# Patient Record
Sex: Male | Born: 1965 | Race: Black or African American | Hispanic: No | Marital: Married | Smoking: Never smoker
Health system: Southern US, Community
[De-identification: ages and names within clinical notes are randomized; demographics above are authoritative.]

## PROBLEM LIST (undated history)

## (undated) DIAGNOSIS — K219 Gastro-esophageal reflux disease without esophagitis: Secondary | ICD-10-CM

## (undated) DIAGNOSIS — R0789 Other chest pain: Secondary | ICD-10-CM

## (undated) DIAGNOSIS — I10 Essential (primary) hypertension: Secondary | ICD-10-CM

## (undated) DIAGNOSIS — G4733 Obstructive sleep apnea (adult) (pediatric): Secondary | ICD-10-CM

## (undated) HISTORY — DX: Gastro-esophageal reflux disease without esophagitis: K21.9

## (undated) HISTORY — DX: Obstructive sleep apnea (adult) (pediatric): G47.33

## (undated) HISTORY — DX: Other chest pain: R07.89

---

## 2004-08-18 ENCOUNTER — Ambulatory Visit: Payer: Self-pay | Admitting: Surgery

## 2004-09-19 ENCOUNTER — Emergency Department: Payer: Self-pay | Admitting: Emergency Medicine

## 2004-10-22 ENCOUNTER — Ambulatory Visit: Payer: Self-pay | Admitting: Gastroenterology

## 2005-11-19 ENCOUNTER — Other Ambulatory Visit: Payer: Self-pay

## 2005-11-19 ENCOUNTER — Emergency Department: Payer: Self-pay | Admitting: Emergency Medicine

## 2006-09-15 ENCOUNTER — Ambulatory Visit: Payer: Self-pay | Admitting: Internal Medicine

## 2006-09-18 ENCOUNTER — Ambulatory Visit: Payer: Self-pay | Admitting: Internal Medicine

## 2007-01-06 ENCOUNTER — Emergency Department: Payer: Self-pay | Admitting: Emergency Medicine

## 2007-01-06 ENCOUNTER — Other Ambulatory Visit: Payer: Self-pay

## 2007-02-24 ENCOUNTER — Other Ambulatory Visit: Payer: Self-pay

## 2007-02-24 ENCOUNTER — Emergency Department: Payer: Self-pay | Admitting: Emergency Medicine

## 2008-02-09 ENCOUNTER — Emergency Department: Payer: Self-pay | Admitting: Emergency Medicine

## 2008-02-11 ENCOUNTER — Other Ambulatory Visit: Payer: Self-pay

## 2008-02-11 ENCOUNTER — Emergency Department: Payer: Self-pay | Admitting: Emergency Medicine

## 2008-03-15 ENCOUNTER — Emergency Department: Payer: Self-pay | Admitting: Emergency Medicine

## 2008-05-21 ENCOUNTER — Emergency Department: Payer: Self-pay | Admitting: Emergency Medicine

## 2008-09-18 ENCOUNTER — Emergency Department: Payer: Self-pay | Admitting: Emergency Medicine

## 2010-03-29 ENCOUNTER — Emergency Department: Payer: Self-pay | Admitting: Emergency Medicine

## 2011-06-21 ENCOUNTER — Emergency Department: Payer: Self-pay | Admitting: *Deleted

## 2011-06-21 LAB — CBC WITH DIFFERENTIAL/PLATELET
Basophil %: 0.3 %
Eosinophil #: 0.1 10*3/uL (ref 0.0–0.7)
Eosinophil %: 1.4 %
HGB: 14.9 g/dL (ref 13.0–18.0)
Lymphocyte %: 30.6 %
MCH: 30.3 pg (ref 26.0–34.0)
MCHC: 33 g/dL (ref 32.0–36.0)
MCV: 92 fL (ref 80–100)
Monocyte #: 0.5 10*3/uL (ref 0.0–0.7)
Monocyte %: 8.4 %
Neutrophil %: 59.3 %
RBC: 4.9 10*6/uL (ref 4.40–5.90)
RDW: 13.5 % (ref 11.5–14.5)
WBC: 5.9 10*3/uL (ref 3.8–10.6)

## 2011-06-21 LAB — BASIC METABOLIC PANEL
BUN: 13 mg/dL (ref 7–18)
Chloride: 105 mmol/L (ref 98–107)
Creatinine: 1.19 mg/dL (ref 0.60–1.30)
EGFR (African American): >60
Glucose: 79 mg/dL (ref 65–99)
Potassium: 4.1 mmol/L (ref 3.5–5.1)
Sodium: 143 mmol/L (ref 136–145)

## 2011-06-21 LAB — T4, FREE: Free Thyroxine: 0.87 ng/dL (ref 0.76–1.46)

## 2012-11-25 ENCOUNTER — Emergency Department: Payer: Self-pay | Admitting: Emergency Medicine

## 2016-05-23 DIAGNOSIS — H5711 Ocular pain, right eye: Secondary | ICD-10-CM | POA: Diagnosis not present

## 2016-05-30 ENCOUNTER — Encounter: Payer: Self-pay | Admitting: Internal Medicine

## 2016-05-30 ENCOUNTER — Ambulatory Visit (INDEPENDENT_AMBULATORY_CARE_PROVIDER_SITE_OTHER): Payer: 59 | Admitting: Internal Medicine

## 2016-05-30 VITALS — BP 142/94 | HR 68 | Temp 98.1°F | Ht 67.5 in | Wt 252.0 lb

## 2016-05-30 DIAGNOSIS — R3129 Other microscopic hematuria: Secondary | ICD-10-CM

## 2016-05-30 DIAGNOSIS — Z6838 Body mass index (BMI) 38.0-38.9, adult: Secondary | ICD-10-CM

## 2016-05-30 DIAGNOSIS — E6609 Other obesity due to excess calories: Secondary | ICD-10-CM | POA: Diagnosis not present

## 2016-05-30 DIAGNOSIS — K219 Gastro-esophageal reflux disease without esophagitis: Secondary | ICD-10-CM | POA: Diagnosis not present

## 2016-05-30 DIAGNOSIS — I10 Essential (primary) hypertension: Secondary | ICD-10-CM | POA: Diagnosis not present

## 2016-05-30 DIAGNOSIS — IMO0001 Reserved for inherently not codable concepts without codable children: Secondary | ICD-10-CM | POA: Insufficient documentation

## 2016-05-30 LAB — COMPREHENSIVE METABOLIC PANEL
ALBUMIN: 4 g/dL (ref 3.5–5.2)
ALK PHOS: 61 U/L (ref 39–117)
ALT: 33 U/L (ref 0–53)
AST: 29 U/L (ref 0–37)
BUN: 11 mg/dL (ref 6–23)
CO2: 31 mEq/L (ref 19–32)
Calcium: 9.5 mg/dL (ref 8.4–10.5)
Chloride: 103 mEq/L (ref 96–112)
Creatinine, Ser: 1.29 mg/dL (ref 0.40–1.50)
GFR: 76.43 mL/min (ref >60.00)
Glucose, Bld: 99 mg/dL (ref 70–99)
POTASSIUM: 4.2 meq/L (ref 3.5–5.1)
Sodium: 138 mEq/L (ref 135–145)
TOTAL PROTEIN: 7.6 g/dL (ref 6.0–8.3)
Total Bilirubin: 0.5 mg/dL (ref 0.2–1.2)

## 2016-05-30 LAB — HEMOGLOBIN A1C: HEMOGLOBIN A1C: 6 % (ref 4.6–6.5)

## 2016-05-30 LAB — LIPID PANEL
Cholesterol: 154 mg/dL (ref 0–200)
HDL: 51.8 mg/dL (ref >39.00)
LDL Cholesterol: 90 mg/dL (ref 0–99)
NONHDL: 102.16
TRIGLYCERIDES: 62 mg/dL (ref 0.0–149.0)
Total CHOL/HDL Ratio: 3
VLDL: 12.4 mg/dL (ref 0.0–40.0)

## 2016-05-30 LAB — POC URINALSYSI DIPSTICK (AUTOMATED)
BILIRUBIN UA: NEGATIVE
Blood, UA: NEGATIVE
Glucose, UA: NEGATIVE
Ketones, UA: NEGATIVE
LEUKOCYTES UA: NEGATIVE
NITRITE UA: NEGATIVE
PH UA: 6.5
Protein, UA: NEGATIVE
Spec Grav, UA: 1.025
Urobilinogen, UA: NEGATIVE

## 2016-05-30 LAB — CBC
HEMATOCRIT: 45.2 % (ref 39.0–52.0)
HEMOGLOBIN: 15 g/dL (ref 13.0–17.0)
MCHC: 33.3 g/dL (ref 30.0–36.0)
MCV: 90.6 fl (ref 78.0–100.0)
Platelets: 184 10*3/uL (ref 150.0–400.0)
RBC: 4.98 Mil/uL (ref 4.22–5.81)
RDW: 13.9 % (ref 11.5–15.5)
WBC: 5.7 10*3/uL (ref 4.0–10.5)

## 2016-05-30 LAB — TSH: TSH: 0.98 u[IU]/mL (ref 0.35–4.50)

## 2016-05-30 NOTE — Assessment & Plan Note (Signed)
He is not sure what is triggering this He was on Protonix in the past but does not want to start back on this now Discussed how reflux untreated over long periods of time can lead to Barrett's Esophagus, he still denies treatment at this time

## 2016-05-30 NOTE — Assessment & Plan Note (Signed)
He reports he does not want to start on medication at this time He wants to work on diet and exercise for a trial of 3 months to get this down CBC, CMET today

## 2016-05-30 NOTE — Progress Notes (Signed)
HPI  Pt presents to the clinic today to establish care and for management of the conditions listed below. He has not had a PCP in many years. He reports he recently had a work physical, and he reports they found blood in his urine. He denies testicular pain, urgency, frequency, dysuria or penile discharge.  GERD: He is not sure what triggers this. This occurs 5 out of 7 days a week. He does not take anything OTC for this.  Elevated blood pressure: His BP today is 142/94. He has never been diagnosed with HTN in the past. He has never been on medication in the past.  Flu: 03/2016 [facility] Colon Screening: 2010, every 5 years Vision Screening: annually Dentist: as needed  Past Medical History:  Diagnosis Date  . GERD (gastroesophageal reflux disease)     No current outpatient prescriptions on file.   No current facility-administered medications for this visit.     No Known Allergies  Family History  Problem Relation Age of Onset  . Arthritis Mother     Social History   Social History  . Marital status: Married    Spouse name: N/A  . Number of children: N/A  . Years of education: N/A   Occupational History  . Not on file.   Social History Main Topics  . Smoking status: Never Smoker  . Smokeless tobacco: Never Used  . Alcohol use Yes     Comment: rare  . Drug use: No  . Sexual activity: Not on file   Other Topics Concern  . Not on file   Social History Narrative  . No narrative on file    ROS:  Constitutional: Denies fever, malaise, fatigue, headache or abrupt weight changes.  Respiratory: Denies difficulty breathing, shortness of breath, cough or sputum production.   Cardiovascular: Denies chest pain, chest tightness, palpitations or swelling in the hands or feet.  Gastrointestinal: Pt reports reflux. Denies abdominal pain, bloating, constipation, diarrhea or blood in the stool.  Neurological: Denies dizziness, difficulty with memory, difficulty  with speech or problems with balance and coordination.  Psych: Denies anxiety, depression, SI/HI.  No other specific complaints in a complete review of systems (except as listed in HPI above).  PE: BP (!) 142/94   Pulse 68   Temp 98.1 F (36.7 C) (Oral)   Ht 5' 7.5" (1.715 m)   Wt 252 lb (114.3 kg)   SpO2 98%   BMI 38.89 kg/m   Wt Readings from Last 3 Encounters:  05/30/16 252 lb (114.3 kg)    General: Appears his stated age, obese in NAD. HEENT: Head: normal shape and size; Eyes: sclera white, no icterus, conjunctiva pink, PERRLA and EOMs intact; Ears: Tm's gray and intact, normal light reflex;Throat/Mouth: Teeth present, mucosa pink and moist, no lesions or ulcerations noted.  Neck: Neck supple, trachea midline. No masses, lumps or thyromegaly present.  Cardiovascular: Normal rate and rhythm. S1,S2 noted.  No murmur, rubs or gallops noted. No JVD or BLE edema. No carotid bruits noted. Pulmonary/Chest: Normal effort and positive vesicular breath sounds. No respiratory distress. No wheezes, rales or ronchi noted.  Abdomen: Soft and nontender. Normal bowel sounds, no bruits noted. No distention or masses noted.  Neurological: Alert and oriented.  Psychiatric: Mood and affect normal. Behavior is normal. Judgment and thought content normal.    BMET    Component Value Date/Time   NA 143 06/21/2011 1220   K 4.1 06/21/2011 1220   CL 105 06/21/2011 1220  CO2 30 06/21/2011 1220   GLUCOSE 79 06/21/2011 1220   BUN 13 06/21/2011 1220   CREATININE 1.19 06/21/2011 1220   CALCIUM 9.1 06/21/2011 1220   GFRNONAA >60 06/21/2011 1220   GFRAA >60 06/21/2011 1220    Lipid Panel  No results found for: CHOL, TRIG, HDL, CHOLHDL, VLDL, LDLCALC  CBC    Component Value Date/Time   WBC 5.9 06/21/2011 1220   RBC 4.90 06/21/2011 1220   HGB 14.9 06/21/2011 1220   HCT 45.0 06/21/2011 1220   PLT 184 06/21/2011 1220   MCV 92 06/21/2011 1220   MCH 30.3 06/21/2011 1220   MCHC 33.0 06/21/2011  1220   RDW 13.5 06/21/2011 1220   LYMPHSABS 1.8 06/21/2011 1220   MONOABS 0.5 06/21/2011 1220   EOSABS 0.1 06/21/2011 1220   BASOSABS 0.0 06/21/2011 1220    Hgb A1C No results found for: HGBA1C   Assessment and Plan:

## 2016-05-30 NOTE — Assessment & Plan Note (Signed)
Discussed the importance of diet and exercise Will check TSH, A1C and Lipid profile  today

## 2016-05-30 NOTE — Patient Instructions (Signed)
Obesity, Adult Introduction Obesity is having too much body fat. If you have a BMI of 30 or more, you are obese. BMI is a number that explains how much body fat you have. Obesity is often caused by taking in (consuming) more calories than your body uses. Obesity can cause serious health problems. Changing your lifestyle can help to treat obesity. Follow these instructions at home: Eating and drinking  Follow advice from your doctor about what to eat and drink. Your doctor may tell you to:  Cut down on (limit) fast foods, sweets, and processed snack foods.  Choose low-fat options. For example, choose low-fat milk instead of whole milk.  Eat 5 or more servings of fruits or vegetables every day.  Eat at home more often. This gives you more control over what you eat.  Choose healthy foods when you eat out.  Learn what a healthy portion size is. A portion size is the amount of a certain food that is healthy for you to eat at one time. This is different for each person.  Keep low-fat snacks available.  Avoid sugary drinks. These include soda, fruit juice, iced tea that is sweetened with sugar, and flavored milk.  Eat a healthy breakfast.  Drink enough water to keep your pee (urine) clear or pale yellow.  Do not go without eating for long periods of time (do not fast).  Do not go on popular or trendy diets (fad diets). Physical Activity  Exercise often, as told by your doctor. Ask your doctor:  What types of exercise are safe for you.  How often you should exercise.  Warm up and stretch before being active.  Do slow stretching after being active (cool down).  Rest between times of being active. Lifestyle  Limit how much time you spend in front of your TV, computer, or video game system (be less sedentary).  Find ways to reward yourself that do not involve food.  Limit alcohol intake to no more than 1 drink a day for nonpregnant women and 2 drinks a day for men. One drink  equals 12 oz of beer, 5 oz of wine, or 1 oz of hard liquor. General instructions  Keep a weight loss journal. This can help you keep track of:  The food that you eat.  The exercise that you do.  Take over-the-counter and prescription medicines only as told by your doctor.  Take vitamins and supplements only as told by your doctor.  Think about joining a support group. Your doctor may be able to help with this.  Keep all follow-up visits as told by your doctor. This is important. Contact a doctor if:  You cannot meet your weight loss goal after you have changed your diet and lifestyle for 6 weeks. This information is not intended to replace advice given to you by your health care provider. Make sure you discuss any questions you have with your health care provider. Document Released: 07/18/2011 Document Revised: 10/01/2015 Document Reviewed: 02/11/2015  2017 Elsevier  

## 2016-05-31 ENCOUNTER — Ambulatory Visit: Payer: 59 | Admitting: Family Medicine

## 2016-06-02 NOTE — Addendum Note (Signed)
Addended by: Roena MaladyEVONTENNO, MELANIE Y on: 06/02/2016 01:10 PM   Modules accepted: Orders

## 2016-08-04 ENCOUNTER — Ambulatory Visit (INDEPENDENT_AMBULATORY_CARE_PROVIDER_SITE_OTHER): Payer: 59 | Admitting: Family Medicine

## 2016-08-04 ENCOUNTER — Encounter: Payer: Self-pay | Admitting: Family Medicine

## 2016-08-04 ENCOUNTER — Other Ambulatory Visit: Payer: Self-pay | Admitting: Family Medicine

## 2016-08-04 VITALS — BP 140/84 | HR 56 | Temp 98.7°F | Ht 67.5 in | Wt 241.2 lb

## 2016-08-04 DIAGNOSIS — N4889 Other specified disorders of penis: Secondary | ICD-10-CM | POA: Diagnosis not present

## 2016-08-04 DIAGNOSIS — N342 Other urethritis: Secondary | ICD-10-CM

## 2016-08-04 NOTE — Progress Notes (Signed)
Pre visit review using our clinic review tool, if applicable. No additional management support is needed unless otherwise documented below in the visit note. 

## 2016-08-04 NOTE — Progress Notes (Signed)
Dr. Karleen Hampshire T. Tara Wich, MD, CAQ Sports [facility] [address] [place], [address] Phone: [phone] Fax: [phone]  08/04/2016  Patient: Chad Schultz, MRN: [id number], DOB: [birth year], 51 y.o.  Primary Physician:  Nicki Reaper, NP   Chief Complaint  Patient presents with  . Penis Pain   Subjective:   Chad Schultz is a 51 y.o. very pleasant male patient who presents with the following:  Wife had a UTI. Hurting inside during sex and also with urination.   No discharge or sores.  No pain with ejaculation.  No known STD exposure.  He is not having any pain at all and his testicular region, and he is not having pain in his perineal region.  No pain with having a bowel movement.  He is not having any bloody discharge from his penis.  Past Medical History, Surgical History, Social History, Family History, Problem List, Medications, and Allergies have been reviewed and updated if relevant.  Patient Active Problem List   Diagnosis Date Noted  . Gastroesophageal reflux disease 05/30/2016  . Essential hypertension 05/30/2016  . Class 2 obesity due to excess calories with serious comorbidity and body mass index (BMI) of 38.0 to 38.9 in adult 05/30/2016    Past Medical History:  Diagnosis Date  . GERD (gastroesophageal reflux disease)     No past surgical history on file.  Social History   Social History  . Marital status: Married    Spouse name: N/A  . Number of children: N/A  . Years of education: N/A   Occupational History  . Not on file.   Social History Main Topics  . Smoking status: Never Smoker  . Smokeless tobacco: Never Used  . Alcohol use Yes     Comment: rare  . Drug use: No  . Sexual activity: Yes   Other Topics Concern  . Not on file   Social History Narrative  . No narrative on file    Family History  Problem Relation Age of Onset  . Arthritis Mother     No Known Allergies  Medication list  reviewed and updated in full in [facility].   GEN: No acute illnesses, no fevers, chills. GI: No n/v/d, eating normally Pulm: No SOB Interactive and getting along well at home.  Otherwise, ROS is as per the HPI.  Objective:   BP 140/84   Pulse (!) 56   Temp 98.7 F (37.1 C) (Oral)   Ht 5' 7.5" (1.715 m)   Wt 241 lb 4 oz (109.4 kg)   BMI 37.23 kg/m   GEN: WDWN, NAD, Non-toxic, A & O x 3 HEENT: Atraumatic, Normocephalic. Neck supple. No masses, No LAD. Ears and Nose: No external deformity. EXTR: No c/c/e NEURO Normal gait.  PSYCH: Normally interactive. Conversant. Not depressed or anxious appearing.  Calm demeanor.   GU: normal-appearing male, circumcised, normal phallus without discharge present.  No ulceration.  Testicles are normal and nontender and palpation.  No hernias are present.  Laboratory and Imaging Data: Results for orders placed or performed in visit on 08/04/16  HIV antibody  Result Value Ref Range   HIV 1&2 Ab, 4th Generation NONREACTIVE NONREACTIVE  Hepatitis C antibody  Result Value Ref Range   HCV Ab NEGATIVE NEGATIVE     Assessment and Plan:   Penile pain - Plan: HIV antibody, Hepatitis C antibody, Urinalysis, Routine w reflex microscopic, Urine culture, GC/Chlamydia Probe Amp, CANCELED: GC/Chlamydia Probe Amp, CANCELED:  GC/chlamydia probe amp, urine  Urethritis  At this time HIV, Hep C, GC, CMZ all neg. RPR added to lab orders over phone.  Urine culture is pending.  Most likely urethritis without STD involvement.  Follow-up: No Follow-up on file.  Orders Placed This Encounter  Procedures  . Urine culture  . GC/Chlamydia Probe Amp  . HIV antibody  . Hepatitis C antibody  . Urinalysis, Routine w reflex microscopic    Signed,  Annsley Akkerman T. Shauntia Levengood, MD   Allergies as of 08/04/2016   No Known Allergies     Medication List    as of 08/04/2016 11:59 PM   You have not been prescribed any medications.

## 2016-08-05 LAB — URINE CULTURE: ORGANISM ID, BACTERIA: NO GROWTH

## 2016-08-05 LAB — GC/CHLAMYDIA PROBE AMP
CT Probe RNA: NOT DETECTED
GC PROBE AMP APTIMA: NOT DETECTED

## 2016-08-05 LAB — URINALYSIS, ROUTINE W REFLEX MICROSCOPIC

## 2016-08-05 LAB — HEPATITIS C ANTIBODY: HCV Ab: NEGATIVE

## 2016-08-05 LAB — HIV ANTIBODY (ROUTINE TESTING W REFLEX): HIV 1&2 Ab, 4th Generation: NONREACTIVE

## 2016-08-06 LAB — RPR

## 2016-08-07 ENCOUNTER — Other Ambulatory Visit: Payer: Self-pay | Admitting: Family Medicine

## 2016-08-07 MED ORDER — DOXYCYCLINE HYCLATE 100 MG PO TABS: 100.0000 mg | ORAL_TABLET | Freq: Two times a day (BID) | ORAL | 0 refills | Status: AC

## 2017-04-05 ENCOUNTER — Encounter: Payer: Self-pay | Admitting: Family Medicine

## 2017-04-05 ENCOUNTER — Ambulatory Visit: Payer: BC Managed Care – PPO | Admitting: Family Medicine

## 2017-04-05 ENCOUNTER — Telehealth: Payer: Self-pay

## 2017-04-05 ENCOUNTER — Ambulatory Visit: Payer: Self-pay | Admitting: Family Medicine

## 2017-04-05 VITALS — BP 140/80 | HR 70 | Temp 97.6°F | Wt 242.2 lb

## 2017-04-05 DIAGNOSIS — M79642 Pain in left hand: Secondary | ICD-10-CM

## 2017-04-05 DIAGNOSIS — M79641 Pain in right hand: Secondary | ICD-10-CM | POA: Diagnosis not present

## 2017-04-05 LAB — CBC WITH DIFFERENTIAL/PLATELET
BASOS ABS: 0 10*3/uL (ref 0.0–0.1)
Basophils Relative: 0.4 % (ref 0.0–3.0)
EOS ABS: 0.1 10*3/uL (ref 0.0–0.7)
Eosinophils Relative: 1.7 % (ref 0.0–5.0)
HCT: 43.8 % (ref 39.0–52.0)
Hemoglobin: 14.3 g/dL (ref 13.0–17.0)
LYMPHS ABS: 1.9 10*3/uL (ref 0.7–4.0)
Lymphocytes Relative: 27.9 % (ref 12.0–46.0)
MCHC: 32.8 g/dL (ref 30.0–36.0)
MCV: 93.6 fl (ref 78.0–100.0)
MONO ABS: 0.7 10*3/uL (ref 0.1–1.0)
MONOS PCT: 10.7 % (ref 3.0–12.0)
NEUTROS ABS: 4.1 10*3/uL (ref 1.4–7.7)
NEUTROS PCT: 59.3 % (ref 43.0–77.0)
PLATELETS: 175 10*3/uL (ref 150.0–400.0)
RBC: 4.68 Mil/uL (ref 4.22–5.81)
RDW: 14.4 % (ref 11.5–15.5)
WBC: 7 10*3/uL (ref 4.0–10.5)

## 2017-04-05 LAB — HEPATIC FUNCTION PANEL
ALBUMIN: 4 g/dL (ref 3.5–5.2)
ALK PHOS: 66 U/L (ref 39–117)
ALT: 23 U/L (ref 0–53)
AST: 26 U/L (ref 0–37)
Bilirubin, Direct: 0.1 mg/dL (ref 0.0–0.3)
TOTAL PROTEIN: 7.1 g/dL (ref 6.0–8.3)
Total Bilirubin: 0.6 mg/dL (ref 0.2–1.2)

## 2017-04-05 LAB — VITAMIN B12: VITAMIN B 12: 632 pg/mL (ref 211–911)

## 2017-04-05 LAB — BASIC METABOLIC PANEL
BUN: 22 mg/dL (ref 6–23)
CALCIUM: 9.3 mg/dL (ref 8.4–10.5)
CO2: 29 meq/L (ref 19–32)
Chloride: 106 mEq/L (ref 96–112)
Creatinine, Ser: 1.4 mg/dL (ref 0.40–1.50)
GFR: 69.29 mL/min (ref >60.00)
GLUCOSE: 94 mg/dL (ref 70–99)
POTASSIUM: 4 meq/L (ref 3.5–5.1)
Sodium: 140 mEq/L (ref 135–145)

## 2017-04-05 LAB — URIC ACID: URIC ACID, SERUM: 5.8 mg/dL (ref 4.0–7.8)

## 2017-04-05 LAB — SEDIMENTATION RATE: SED RATE: 15 mm/h (ref 0–15)

## 2017-04-05 LAB — TSH: TSH: 1.12 u[IU]/mL (ref 0.35–4.50)

## 2017-04-05 LAB — C-REACTIVE PROTEIN: CRP: 0.3 mg/dL — AB (ref 0.5–20.0)

## 2017-04-05 NOTE — Progress Notes (Signed)
   Subjective:    Patient ID: Chad Schultz, male    DOB: 1965/12/01, 51 y.o.   MRN: [id number]  HPI Here for 3 months of left hand symptoms and now one weel of similar symptoms in the right hand. These are intermittent. At times he feels pain in the dorsum of each hand, at times he feels numbness and tingling of the dorsum of each hand. No erythem aor warmth, no swelling. He has not taken anything for this. No other joint issues. Of note his mother has rheumatoid arthritis and his father has gout.    Review of Systems  Constitutional: Negative.   Respiratory: Negative.   Cardiovascular: Negative.   Musculoskeletal: Positive for arthralgias. Negative for joint swelling.  Neurological: Positive for numbness. Negative for weakness.       Objective:   Physical Exam  Constitutional: He is oriented to person, place, and time. He appears well-developed and well-nourished.  Cardiovascular: Normal rate, regular rhythm, normal heart sounds and intact distal pulses.  Pulmonary/Chest: Effort normal and breath sounds normal. No respiratory distress. He has no wheezes. He has no rales.  Musculoskeletal:  Both hands are normal on exam today. No warmth or erythema, no swelling, and no tenderness.   Neurological: He is alert and oriented to person, place, and time.          Assessment & Plan:  Hand pain. It is not clear what this may be stemming from. I suggested he take an Aleve BID. Get labs today to check for various arthritis markers. Follow up with Nicki Reaperegina Baity, his PCP. Gershon CraneStephen Dusan Lipford, MD

## 2017-04-05 NOTE — Telephone Encounter (Signed)
Pt walked in; [facility] or [facility]. Pt said for 3 wks on and off tops of both hands hurt and at times when moves hands feels like something is ripping. When hurts pain level is 8. Pt said feels worse today. Pt scheduled appt to see Dr Clent RidgesFry today at 2 PM.

## 2017-04-06 LAB — RHEUMATOID FACTOR

## 2017-04-18 ENCOUNTER — Ambulatory Visit: Payer: BC Managed Care – PPO | Admitting: Internal Medicine

## 2017-04-24 ENCOUNTER — Ambulatory Visit: Payer: BC Managed Care – PPO | Admitting: Internal Medicine

## 2017-04-26 ENCOUNTER — Encounter: Payer: Self-pay | Admitting: Family Medicine

## 2017-04-26 ENCOUNTER — Ambulatory Visit: Payer: BC Managed Care – PPO | Admitting: Family Medicine

## 2017-04-26 VITALS — BP 122/80 | HR 62 | Temp 98.4°F | Wt 241.4 lb

## 2017-04-26 DIAGNOSIS — G5603 Carpal tunnel syndrome, bilateral upper limbs: Secondary | ICD-10-CM

## 2017-04-26 DIAGNOSIS — M7711 Lateral epicondylitis, right elbow: Secondary | ICD-10-CM

## 2017-04-26 MED ORDER — DICLOFENAC SODIUM 75 MG PO TBEC: 75.0000 mg | DELAYED_RELEASE_TABLET | Freq: Two times a day (BID) | ORAL | 0 refills | Status: DC

## 2017-04-26 NOTE — Progress Notes (Signed)
   Subjective:    Patient ID: Chad Schultz, male    DOB: [birth year], 51 y.o.   MRN: [id number]  HPI Here for several issues. First he saw us a few weeks ago for pain and numbness in both hands. This has persisted though Ibuprofen helps a little. Also he has had 2 weeks of pain in the right forearm. This starts at the elbow and runs down to the thumb. At our last visit we got labs including inflammatory markers, and these were all negative.    Review of Systems  Constitutional: Negative.   Respiratory: Negative.   Cardiovascular: Negative.   Musculoskeletal: Positive for arthralgias.  Neurological: Positive for numbness. Negative for weakness.       Objective:   Physical Exam  Constitutional: He is oriented to person, place, and time. He appears well-developed and well-nourished.  Cardiovascular: Normal rate, regular rhythm, normal heart sounds and intact distal pulses.  Pulmonary/Chest: Effort normal and breath sounds normal. No respiratory distress. He has no rales.  Musculoskeletal:  The right lateral epicondyle is tender, no swelling and full ROM   Neurological: He is alert and oriented to person, place, and time.          Assessment & Plan:  He has lateral epicondylitis, and he will use rest and ice and Diclofenac. For the hand pain and numbness, carpal tunnel syndrome is a possible etiology. We will set up bilateral nerve conduction studies.  Gershon CraneStephen Shota Kohrs, MD

## 2018-08-09 ENCOUNTER — Ambulatory Visit: Payer: BC Managed Care – PPO | Admitting: Family Medicine

## 2018-08-10 ENCOUNTER — Ambulatory Visit: Payer: BC Managed Care – PPO | Admitting: Family Medicine

## 2018-08-17 ENCOUNTER — Ambulatory Visit (INDEPENDENT_AMBULATORY_CARE_PROVIDER_SITE_OTHER): Payer: BC Managed Care – PPO | Admitting: Family Medicine

## 2018-08-17 ENCOUNTER — Other Ambulatory Visit: Payer: Self-pay

## 2018-08-17 DIAGNOSIS — Z5329 Procedure and treatment not carried out because of patient's decision for other reasons: Secondary | ICD-10-CM

## 2018-08-17 NOTE — Progress Notes (Signed)
No show

## 2018-09-13 ENCOUNTER — Emergency Department: Payer: BC Managed Care – PPO

## 2018-09-13 ENCOUNTER — Encounter: Payer: Self-pay | Admitting: Emergency Medicine

## 2018-09-13 ENCOUNTER — Other Ambulatory Visit: Payer: Self-pay

## 2018-09-13 ENCOUNTER — Emergency Department
Admission: EM | Admit: 2018-09-13 | Discharge: 2018-09-13 | Disposition: A | Discharge status: Home / Self Care | Payer: BC Managed Care – PPO | Attending: Student in an Organized Health Care Education/Training Program | Admitting: Student in an Organized Health Care Education/Training Program

## 2018-09-13 DIAGNOSIS — R519 Headache, unspecified: Secondary | ICD-10-CM

## 2018-09-13 DIAGNOSIS — Z1159 Encounter for screening for other viral diseases: Secondary | ICD-10-CM | POA: Insufficient documentation

## 2018-09-13 DIAGNOSIS — K219 Gastro-esophageal reflux disease without esophagitis: Secondary | ICD-10-CM | POA: Diagnosis not present

## 2018-09-13 DIAGNOSIS — I10 Essential (primary) hypertension: Secondary | ICD-10-CM | POA: Insufficient documentation

## 2018-09-13 DIAGNOSIS — R51 Headache: Secondary | ICD-10-CM | POA: Diagnosis not present

## 2018-09-13 LAB — COMPREHENSIVE METABOLIC PANEL
ALT: 33 U/L (ref 0–44)
AST: 33 U/L (ref 15–41)
Albumin: 4 g/dL (ref 3.5–5.0)
Alkaline Phosphatase: 54 U/L (ref 38–126)
Anion gap: 6 (ref 5–15)
BUN: 21 mg/dL — ABNORMAL HIGH (ref 6–20)
CO2: 29 mmol/L (ref 22–32)
Calcium: 9.3 mg/dL (ref 8.9–10.3)
Chloride: 103 mmol/L (ref 98–111)
Creatinine, Ser: 1.25 mg/dL — ABNORMAL HIGH (ref 0.61–1.24)
GFR calc Af Amer: >60 mL/min (ref >60)
GFR calc non Af Amer: >60 mL/min (ref >60)
Glucose, Bld: 99 mg/dL (ref 70–99)
Potassium: 4.5 mmol/L (ref 3.5–5.1)
Sodium: 138 mmol/L (ref 135–145)
Total Bilirubin: 0.6 mg/dL (ref 0.3–1.2)
Total Protein: 7.9 g/dL (ref 6.5–8.1)

## 2018-09-13 LAB — CBC WITH DIFFERENTIAL/PLATELET
Abs Immature Granulocytes: 0.02 10*3/uL (ref 0.00–0.07)
Basophils Absolute: 0 10*3/uL (ref 0.0–0.1)
Basophils Relative: 1 %
Eosinophils Absolute: 0.2 10*3/uL (ref 0.0–0.5)
Eosinophils Relative: 4 %
HCT: 44.5 % (ref 39.0–52.0)
Hemoglobin: 14.8 g/dL (ref 13.0–17.0)
Immature Granulocytes: 0 %
Lymphocytes Relative: 36 %
Lymphs Abs: 2.2 10*3/uL (ref 0.7–4.0)
MCH: 30.5 pg (ref 26.0–34.0)
MCHC: 33.3 g/dL (ref 30.0–36.0)
MCV: 91.8 fL (ref 80.0–100.0)
Monocytes Absolute: 0.7 10*3/uL (ref 0.1–1.0)
Monocytes Relative: 12 %
Neutro Abs: 2.9 10*3/uL (ref 1.7–7.7)
Neutrophils Relative %: 47 %
Platelets: 189 10*3/uL (ref 150–400)
RBC: 4.85 MIL/uL (ref 4.22–5.81)
RDW: 13.3 % (ref 11.5–15.5)
Smear Review: NORMAL
WBC: 6 10*3/uL (ref 4.0–10.5)
nRBC: 0 % (ref 0.0–0.2)

## 2018-09-13 LAB — SARS CORONAVIRUS 2 BY RT PCR (HOSPITAL ORDER, PERFORMED IN ~~LOC~~ HOSPITAL LAB): SARS Coronavirus 2: NEGATIVE

## 2018-09-13 LAB — TROPONIN I: Troponin I: <0.03 ng/mL (ref <0.03)

## 2018-09-13 LAB — LIPASE, BLOOD: Lipase: 37 U/L (ref 11–51)

## 2018-09-13 MED ORDER — AMLODIPINE BESYLATE 5 MG PO TABS: 5.0000 mg | ORAL_TABLET | Freq: Every day | ORAL | 5 refills | Status: DC

## 2018-09-13 MED ORDER — AMLODIPINE BESYLATE 5 MG PO TABS
5.0000 mg | ORAL_TABLET | Freq: Once | ORAL | Status: AC
  Administered 2018-09-13: 5 mg via ORAL
  Filled 2018-09-13: qty 1

## 2018-09-13 NOTE — ED Triage Notes (Signed)
Patient ambulatory to triage with steady gait, without difficulty or distress noted, mask in place; pt reports occipital HA for several days, mostly at night with no accomp symptoms; denies hx of same

## 2018-09-13 NOTE — Discharge Instructions (Signed)
As we discussed, I believe that your headaches are caused by a variety of reasons.  I think you probably suffer from sleep apnea, although that is not something I can diagnose from the emergency department.  I included some information in this packet for you to read about.  You need to follow-up with a primary care provider to discuss whether or not you should have a sleep study performed.  Additionally I think you are having some symptoms of acid reflux.  I also included some information about this and encourage you to try an over-the-counter acid reflux medication such as Prilosec OTC.  Finally, it does appear that you have at least a degree of high blood pressure (hypertension).  Since you are not actively going to a primary care doctor at this time, I have started you on a low-dose of a blood pressure medicine.  I encourage you to follow-up in the next available opportunity with a primary care provider and discuss your symptoms and see how your blood pressure is doing on the medication I have prescribed.  If you develop any new or worsening symptoms that concern you, please return immediately to the nearest emergency department.

## 2018-09-13 NOTE — ED Provider Notes (Signed)
[facility] Emergency Department Provider Note  ____________________________________________   First MD Initiated Contact with Patient 09/13/18 [phone]     (approximate)  I have reviewed the triage vital signs and the nursing notes.   HISTORY  Chief Complaint Headache    HPI Chad Schultz is a 53 y.o. male with medical history as listed below who presents for evaluation of a variety of complaints.  He states that he has had an intermittent headache that is mostly in the back of his head but sometimes involves the whole head.  This is been going on for about 3 weeks.  It is worse at night and after he just wakes up and gets better throughout the course of the day.  He also states that over the last week or more he has had a sore throat that feels worse in the morning and he has felt like his throat is very dry.  He has had some general malaise, generalized weakness and in general just not feeling well.  3 weeks ago he had some muscle aches that felt kind of like the flu but that resolved.  He has had some nausea particular in the mornings but no vomiting.  He denies fever/chills.  He has no difficulty swallowing.  He has had a mild cough from time to time.  No shortness of breath, no chest pain.  He occasionally has some burning upper abdominal pain that he associates with acid reflux and he states that that particular pain is worse if he eats before he goes to bed.   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        Past Medical History:  Diagnosis Date   GERD (gastroesophageal reflux disease)     Patient Active Problem List   Diagnosis Date Noted   Gastroesophageal reflux disease 05/30/2016   Essential hypertension 05/30/2016   Class 2 obesity due to excess calories with serious comorbidity and body mass index (BMI) of 38.0 to  38.9 in adult 05/30/2016    History reviewed. No pertinent surgical history.  Prior to Admission medications   Medication Sig Start Date End Date Taking? Authorizing Provider  amLODipine (NORVASC) 5 MG tablet Take 1 tablet (5 mg total) by mouth daily. 09/13/18 09/13/19  Loleta Rose, MD  diclofenac (VOLTAREN) 75 MG EC tablet Take 1 tablet (75 mg total) by mouth 2 (two) times daily. 04/26/17   Nelwyn Salisbury, MD  ibuprofen (ADVIL,MOTRIN) 200 MG tablet Take 200 mg by mouth every 12 (twelve) hours as needed.    [provider]    Allergies Patient has no known allergies.  Family History  Problem Relation Age of Onset   Arthritis Mother     Social History Social History   Tobacco Use   Smoking status: Never Smoker   Smokeless tobacco: Never Used  Substance Use Topics   Alcohol use: Yes    Comment: rare   Drug use: No    Review of Systems Constitutional: No fever/chills Eyes: No visual changes. ENT: Intermittent sore throat as described above.  Intermittent dry mouth as described above. Cardiovascular: Denies chest pain. Respiratory: Denies shortness of breath.  Occasional cough. Gastrointestinal: Intermittent upper abdominal burning with nausea, no vomiting.  No diarrhea.  No constipation. Genitourinary: Negative for dysuria. Musculoskeletal: Negative for neck pain.  Negative for back pain. Integumentary: Negative for rash.  Neurological: Intermittent headache as described above.  No focal numbness nor weakness.   ____________________________________________   PHYSICAL EXAM:  VITAL SIGNS: ED Triage Vitals  Enc Vitals Group     BP 09/13/18 0605 (!) 177/107     Pulse Rate 09/13/18 0605 (!) 54     Resp 09/13/18 0605 18     Temp 09/13/18 0605 97.7 F (36.5 C)     Temp Source 09/13/18 0605 Oral     SpO2 09/13/18 0605 100 %     Weight 09/13/18 0600 104.3 kg (230 lb)     Height 09/13/18 0600 1.727 m (5\' 8" )     Head Circumference --      Peak Flow --       Pain Score 09/13/18 0559 5     Pain Loc --      Pain Edu? --      Excl. in GC? --     Constitutional: Alert and oriented. Well appearing and in no acute distress. Eyes: Conjunctivae are normal. PERRL. EOMI. Head: Atraumatic. Nose: No congestion/rhinnorhea. Mouth/Throat: Mucous membranes are moist. Neck: No stridor.  No meningeal signs.   Cardiovascular: Mild bradycardia, regular rhythm. Good peripheral circulation. Grossly normal heart sounds. Respiratory: Normal respiratory effort.  No retractions. No audible wheezing. Gastrointestinal: Moderate obesity.  Soft and nontender. No distention.  Musculoskeletal: No lower extremity tenderness nor edema. No gross deformities of extremities. Neurologic:  Normal speech and language. No gross focal neurologic deficits are appreciated.  Skin:  Skin is warm, dry and intact. No rash noted. Psychiatric: Mood and affect are normal. Speech and behavior are normal.  ____________________________________________   LABS (all labs ordered are listed, but only abnormal results are displayed)  Labs Reviewed  SARS CORONAVIRUS 2 (HOSPITAL ORDER, PERFORMED IN [facility])  CBC WITH DIFFERENTIAL/PLATELET  TROPONIN I  COMPREHENSIVE METABOLIC PANEL  LIPASE, BLOOD   ____________________________________________  EKG  ED ECG REPORT I, Loleta Roseory Belvin Gauss, the attending physician, personally viewed and interpreted this ECG.  Date: 09/13/2018 EKG Time: 7:14 Rate: 55 Rhythm: normal bradycardia QRS Axis: normal Intervals: mildly prolonged QTc at 493 ms ST/T Wave abnormalities: Non-specific ST segment / T-wave changes, but no clear evidence of acute ischemia. Narrative Interpretation: no definitive evidence of acute ischemia; does not meet STEMI criteria.   ____________________________________________  RADIOLOGY   ED MD interpretation: CT head without contrast is pending.  Official radiology report(s): No results  found.  ____________________________________________   PROCEDURES   Procedure(s) performed (including Critical Care):  Procedures   ____________________________________________   INITIAL IMPRESSION / MDM / ASSESSMENT AND PLAN / ED COURSE  As part of my medical decision making, I reviewed the following data within the electronic MEDICAL RECORD NUMBER Nursing notes reviewed and incorporated, Labs reviewed , Old chart reviewed, Patient signed out to Dr. Roxan Hockeyobinson and reviewed Notes from prior ED visits      *Ernestene MentionRobert L Holwerda was evaluated in Emergency Department on 09/13/2018 for the symptoms described in the history of present illness. He was evaluated in the context of the global COVID-19 pandemic, which necessitated consideration that the patient might be at risk for infection with the SARS-CoV-2 virus that causes COVID-19. Institutional protocols and algorithms that pertain to the evaluation of patients at risk for COVID-19 are in a state of rapid change based on information released by regulatory bodies including the CDC and federal and state organizations. These policies and algorithms were followed during the patient's care in the ED.*  Differential diagnosis includes, but  is not limited to, sleep apnea, acid reflux, migraine headache, cluster headache, acute intracranial bleeding, brain neoplasm, pancreatitis, biliary disease, ACS, COVID-19.  The patient is having no respiratory symptoms but he has a variety of other symptoms suggestive of viral illness including a sore throat, headache, recent myalgias, etc.  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  This is obviously of concern to him.  However overall his constellation of symptoms I think is most likely due to poorly controlled hypertension, sleep apnea, and acid reflux.  The patient and I discussed all of this  including my presumptive diagnoses.  He has a primary care doctor but he never goes to see her.  I am going to evaluate him probably given his lack of ability to follow-up easily, particularly in the middle of a COVID-19 pandemic, with lab work, CT scan of his head to rule out acute intracranial issues, EKG, and coronavirus testing given his higher risk than normal due to where he works.  His blood pressure is consistently elevated in the ED and under the circumstances I think it is reasonable to start him on a low-dose of amlodipine 5 mg by mouth daily at least until he can follow-up with her primary care provider.  I am giving him a first dose now.  I am transferring emergency department care to Dr. Roxan Hockey to follow-up on the imaging and lab results.  Anticipate discharge with appropriate precautions if he test positive for COVID-19 and otherwise the discharge paperwork has been prepared for outpatient follow-up with a PCP.      ____________________________________________  FINAL CLINICAL IMPRESSION(S) / ED DIAGNOSES  Final diagnoses:  Nonintractable episodic headache, unspecified headache type  Essential hypertension  Gastroesophageal reflux disease, esophagitis presence not specified     MEDICATIONS GIVEN DURING THIS VISIT:  Medications  amLODipine (NORVASC) tablet 5 mg (has no administration in time range)     ED Discharge Orders         Ordered    amLODipine (NORVASC) 5 MG tablet  Daily     09/13/18 0641           Note:  This document was prepared using Dragon voice recognition software and may include unintentional dictation errors.   Loleta Rose, MD 09/13/18 [phone]

## 2018-09-13 NOTE — ED Provider Notes (Signed)
Patient received in signout from Dr. York Cerise.  Work-up is reassuring.  Patient asymptomatic.  Neuro exam is nonfocal.  No evidence of coronavirus.  On recent does sound the patient has high risk for sleep apnea and patient for me that he actually did have a sleep study several years ago and was recommended for CPAP but said that he never followed up.  At this point do believe he stable and appropriate for outpatient follow-up.  Discussed signs and symptoms for which he should return to the ER.   Willy Eddy, MD 09/13/18 [phone]

## 2018-10-18 ENCOUNTER — Ambulatory Visit: Payer: BC Managed Care – PPO | Admitting: Family Medicine

## 2019-05-30 ENCOUNTER — Encounter: Payer: Self-pay | Admitting: Family Medicine

## 2019-06-18 ENCOUNTER — Other Ambulatory Visit: Payer: Self-pay | Admitting: Podiatry

## 2019-06-18 ENCOUNTER — Other Ambulatory Visit: Payer: Self-pay

## 2019-06-18 ENCOUNTER — Ambulatory Visit (INDEPENDENT_AMBULATORY_CARE_PROVIDER_SITE_OTHER): Payer: BC Managed Care – PPO

## 2019-06-18 ENCOUNTER — Encounter: Payer: Self-pay | Admitting: Podiatry

## 2019-06-18 ENCOUNTER — Ambulatory Visit: Payer: BC Managed Care – PPO | Admitting: Orthotics

## 2019-06-18 ENCOUNTER — Ambulatory Visit: Payer: BC Managed Care – PPO | Admitting: Podiatry

## 2019-06-18 DIAGNOSIS — M79671 Pain in right foot: Secondary | ICD-10-CM

## 2019-06-18 DIAGNOSIS — M2142 Flat foot [pes planus] (acquired), left foot: Secondary | ICD-10-CM

## 2019-06-18 DIAGNOSIS — M19071 Primary osteoarthritis, right ankle and foot: Secondary | ICD-10-CM

## 2019-06-18 DIAGNOSIS — M2141 Flat foot [pes planus] (acquired), right foot: Secondary | ICD-10-CM

## 2019-06-18 NOTE — Progress Notes (Signed)
Subjective:  Patient ID: Chad Schultz, male    DOB: [birth year],  MRN: [id number]  Chief Complaint  Patient presents with  . Foot Pain    pt is here for right foot big toe pain, pain has been going on for about a year, pt states that the pain is often on and off.     54 y.o. male presents with the above complaint.  She is here for right first metatarsophalangeal joint arthritic pain.  Patient states that this has been going on for about a year.  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  Patient states the pain is on and off but has been getting worse with time.  Patient has a hard time applying pressure.  Patient states the pain is internal.  He has not tried any alleviating factors or any other treatment options.  He has not seen anyone else for this.   Review of Systems: Negative except as noted in the HPI. Denies N/V/F/Ch.  Past Medical History:  Diagnosis Date  . GERD (gastroesophageal reflux disease)     Current Outpatient Medications:  .  amLODipine (NORVASC) 10 MG tablet, Take 10 mg by mouth daily., Disp: , Rfl:  .  amLODipine (NORVASC) 5 MG tablet, Take 1 tablet (5 mg total) by mouth daily., Disp: 30 tablet, Rfl: 5 .  diclofenac (VOLTAREN) 75 MG EC tablet, Take 1 tablet (75 mg total) by mouth 2 (two) times daily., Disp: 60 tablet, Rfl: 0 .  ibuprofen (ADVIL,MOTRIN) 200 MG tablet, Take 200 mg by mouth every 12 (twelve) hours as needed., Disp: , Rfl:  .  omeprazole (PRILOSEC) 20 MG capsule, Take 20 mg by mouth daily., Disp: , Rfl:   Social History   Tobacco Use  Smoking Status Never Smoker  Smokeless Tobacco Never Used    No Known Allergies Objective:  There were no vitals filed for this visit. There is no height or weight on file to calculate BMI. Constitutional Well developed. Well nourished.  Vascular Dorsalis pedis pulses palpable bilaterally. Posterior tibial pulses palpable bilaterally. Capillary refill normal to all  digits.  No cyanosis or clubbing noted. Pedal hair growth normal.  Neurologic Normal speech. Oriented to person, place, and time. Epicritic sensation to light touch grossly present bilaterally.  Dermatologic Nails well groomed and normal in appearance. No open wounds. No skin lesions.  Orthopedic:  Pain on palpation to the right metatarsophalangeal joint.  Pain with range of motion of the first metatarsophalangeal joint.  Crepitus noted especially at the end range of motion.  Intra-articular pain noted.   Radiographs: 3 views of skeletally mature adult foot: Severe arthritic changes noted to the right first metatarsophalangeal joint with decrease in joint space evenly.  Subchondral sclerosis present at the base of the proximal phalanx.  No osteophytes or loose bodies noted. Assessment:   1. Foot pain, right   2. Arthritis of first metatarsophalangeal (MTP) joint of right foot    Plan:  Patient was evaluated and treated and all questions answered.  Right first metatarsophalangeal joint arthritis -I explained to the patient the etiology of arthritis and various treatment options associated with that including shoe gear modification orthotics and injection.  I explained to the patient that he will benefit from a steroid injection to help decrease the acute inflammatory portion of the pain and therefore allow him to ambulate with applying more pressure.  Patient agrees with the plan would like to proceed with a  steroid injection -A steroid injection was performed at right first metatarsophalangeal joint using 1% plain Lidocaine and 10 mg of Kenalog. This was well tolerated.  Semiflexible pes planus -I explained to the patient the etiology of pes planus deformity and various treatment options associated with that including orthotics management.  Patient will be scheduled to see Liliane Channel today for custom-made orthotics with Morton's extension to help offload the first metatarsophalangeal joint as  there is severe arthritic changes to the joint.    Return in about 4 weeks (around 07/16/2019), or see Dr. Posey Pronto, for See Liliane Channel for orthotics.

## 2019-06-20 ENCOUNTER — Ambulatory Visit: Payer: BC Managed Care – PPO | Admitting: Gastroenterology

## 2019-06-20 ENCOUNTER — Other Ambulatory Visit: Payer: Self-pay

## 2019-06-20 VITALS — BP 150/94 | HR 57 | Temp 98.1°F | Ht 68.0 in | Wt 244.2 lb

## 2019-06-20 DIAGNOSIS — K219 Gastro-esophageal reflux disease without esophagitis: Secondary | ICD-10-CM

## 2019-06-20 NOTE — Patient Instructions (Signed)

## 2019-06-20 NOTE — Progress Notes (Signed)
Wyline Mood MD, MRCP(U.K) [address]  [address]  [place], [place] [address]  Main: [phone]  Fax: [phone]   Gastroenterology Consultation  Referring Provider:     Toy Cookey, FNP Primary Care Physician:  Toy Cookey, FNP Primary Gastroenterologist:  Dr. Wyline Mood  Reason for Consultation:    GERD        HPI:   GERRELL TABET is a 54 y.o. y/o male referred for consultation & management  by Toy Cookey, FNP.    He states that he has had acid reflux for over 15 years.  His main symptoms are heartburn.  Depends on the type of foods he eats.  He has gained about 5 to 10 pounds over the last few years.  He recalls he had an endoscopy about 10 years back and was told he really needed to control his acid reflux. Medications he has commenced on his Nexium started just 4 days back.  He has not been taking it at the same time every day. Denies any dysphagia.  Past Medical History:  Diagnosis Date  . GERD (gastroesophageal reflux disease)     No past surgical history on file.  Prior to Admission medications   Medication Sig Start Date End Date Taking? Authorizing Provider  amLODipine (NORVASC) 10 MG tablet Take 10 mg by mouth daily. 05/21/19   [provider]  amLODipine (NORVASC) 5 MG tablet Take 1 tablet (5 mg total) by mouth daily. 09/13/18 09/13/19  Loleta Rose, MD  diclofenac (VOLTAREN) 75 MG EC tablet Take 1 tablet (75 mg total) by mouth 2 (two) times daily. 04/26/17   Nelwyn Salisbury, MD  ibuprofen (ADVIL,MOTRIN) 200 MG tablet Take 200 mg by mouth every 12 (twelve) hours as needed.    [provider]  omeprazole (PRILOSEC) 20 MG capsule Take 20 mg by mouth daily. 05/21/19   [provider]    Family History  Problem Relation Age of Onset  . Arthritis Mother      Social History   Tobacco Use  . Smoking status: Never Smoker  . Smokeless tobacco: Never Used  Substance Use Topics  . Alcohol use: Yes    Comment: rare  .  Drug use: No    Allergies as of 06/20/2019  . (No Known Allergies)    Review of Systems:    All systems reviewed and negative except where noted in HPI.   Physical Exam:  There were no vitals taken for this visit. No LMP for male patient. Psych:  Alert and cooperative. Normal mood and affect. General:   Alert,  Well-developed, well-nourished, pleasant and cooperative in NAD Head:  Normocephalic and atraumatic. Eyes:  Sclera clear, no icterus.   Conjunctiva pink. Ears:  Normal auditory acuity. Lungs:  Respirations even and unlabored.  Clear throughout to auscultation.   No wheezes, crackles, or rhonchi. No acute distress. Heart:  Regular rate and rhythm; no murmurs, clicks, rubs, or gallops. Abdomen:  Normal bowel sounds.  No bruits.  Soft, non-tender and non-distended without masses, hepatosplenomegaly or hernias noted.  No guarding or rebound tenderness.    Neurologic:  Alert and oriented x3;  grossly normal neurologically. Psych:  Alert and cooperative. Normal mood and affect.  Imaging Studies: DG Foot Complete Right  Result Date: 06/18/2019 Please see detailed radiograph report in office note.   Assessment and Plan:   LEELYNN WHETSEL is a 54 y.o. y/o male has been referred for GERD.  No red flag signs.  Plan  1. GERD : Counseled on life style changes, suggest to use PPI first thing in the morning on empty stomach and eat 30 minutes after. Advised on the use of a wedge pillow at night , avoid meals for 2 hours prior to bed time. Weight loss .Discussed the risks and benefits of long term PPI use including but not limited to bone loss, chronic kidney disease, infections , low magnesium . Aim to use at the lowest dose for the shortest period of time   2.  EGD to screen for Barrett's esophagus.  He is not due for colon cancer screening as he had his last colonoscopy 5 years back and had no polyps as per his memory.  No family history of colon cancer or polyps.    Follow up in 3  months   Dr Jonathon Bellows MD,MRCP(U.K)

## 2019-07-12 ENCOUNTER — Other Ambulatory Visit: Admission: RE | Admit: 2019-07-12 | Payer: BC Managed Care – PPO | Source: Ambulatory Visit

## 2019-07-15 ENCOUNTER — Telehealth: Payer: Self-pay | Admitting: Gastroenterology

## 2019-07-15 NOTE — Telephone Encounter (Signed)
Spoke with pt regarding his request to reschedule his EGD procedure. Pt states he doesn't get his biweekly COVID test at his job until next week but is unsure of the date. Pt plans to call back tomorrow to inform us of the exact test date so that we can reschedule the procedure accordingly. For now, [facility] has moved pt's procedure date from 07-16-19 to 07-25-19.

## 2019-07-15 NOTE — Telephone Encounter (Signed)
Pt left vm to r/s his procedure due to needing a swap he can not do it today

## 2019-07-16 ENCOUNTER — Ambulatory Visit: Payer: BC Managed Care – PPO | Admitting: Podiatry

## 2019-07-18 NOTE — Telephone Encounter (Signed)
Called pt to follow up on his COVID test date and rescheduling the procedure.  Unable to contact, LVM to return call

## 2019-07-23 ENCOUNTER — Other Ambulatory Visit: Admission: RE | Admit: 2019-07-23 | Payer: BC Managed Care – PPO | Source: Ambulatory Visit

## 2019-07-23 ENCOUNTER — Telehealth: Payer: Self-pay

## 2019-07-23 NOTE — Telephone Encounter (Signed)
Pt called and LVM regarding his procedure date.  Returned pt's call but was unable to contact. LVM to return call

## 2019-07-23 NOTE — Telephone Encounter (Signed)
Left detailed voice message asking patient to please contact us in regards to his procedure he has scheduled with Dr. Tobi Bastos, because he informed us that he would be faxing the results to [place].  We have not received the results per Trish in Endo. Provided patient with our fax number and the Endoscopy Dept fax number.  Thanks,  [place], [place]

## 2019-07-24 ENCOUNTER — Telehealth: Payer: Self-pay

## 2019-07-24 NOTE — Telephone Encounter (Signed)
Pt has returned call in regard to not having COVID test results to [place] to keep his EGD as scheduled for tomorrow.  He said the results he has on his phone is dated for 03/11-I advised that we will need to reschedule due to the test needing to be within the 7 day window of his procedure.  He has rescheduled to 08/07/19 advised him to have his COVID test at [facility] on Monday 08/05/19 at 10:30am.  Raynelle Fanning in Endo has been informed of date change.  Thanks,  [place], [place]

## 2019-07-25 ENCOUNTER — Ambulatory Visit: Payer: BC Managed Care – PPO | Admitting: Podiatry

## 2019-07-30 ENCOUNTER — Ambulatory Visit: Payer: BC Managed Care – PPO | Admitting: Podiatry

## 2019-08-05 ENCOUNTER — Other Ambulatory Visit: Admission: RE | Admit: 2019-08-05 | Payer: BC Managed Care – PPO | Source: Ambulatory Visit

## 2019-08-07 ENCOUNTER — Ambulatory Visit
Admission: RE | Admit: 2019-08-07 | Payer: BC Managed Care – PPO | Source: Ambulatory Visit | Admitting: Gastroenterology

## 2019-08-07 ENCOUNTER — Encounter: Admission: RE | Payer: Self-pay | Source: Ambulatory Visit

## 2019-08-07 SURGERY — ESOPHAGOGASTRODUODENOSCOPY (EGD) WITH PROPOFOL
Anesthesia: General

## 2019-10-31 NOTE — Progress Notes (Signed)
Cast today for custom foot orthotics per dr patel to address foot pain rt. Plan Richy w/ full length, arch support, rf stability. Forefoot cushioning.

## 2020-03-13 IMAGING — CT CT HEAD WITHOUT CONTRAST
3 series · 15 of 47 positions shown, 18 images · non-contrast
Comparison: None.

CLINICAL DATA: Occipital headache for the past 2 days.

EXAM:
CT HEAD WITHOUT CONTRAST
TECHNIQUE: Contiguous axial images were obtained from the base of the skull
through the vertex without intravenous contrast.

[Series 2: head wo · axial · 0.47mm/px · z∈[-135,-5]mm · 9 of 32 slices shown, 12 images]
[im 3/32  brain]
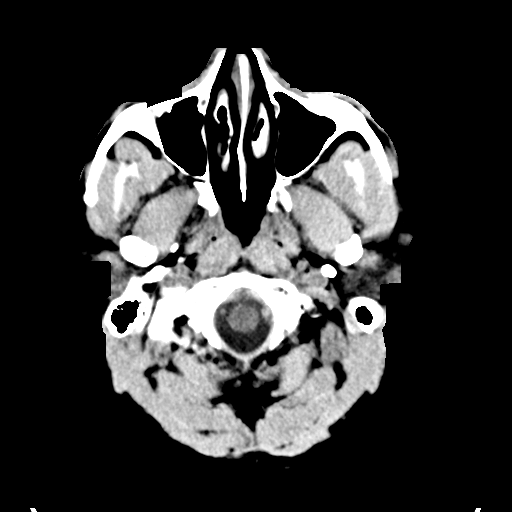
[im 3/32  bone]
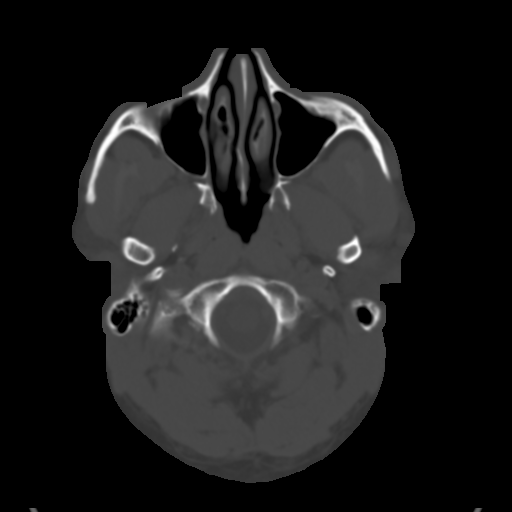
[im 6/32  brain]
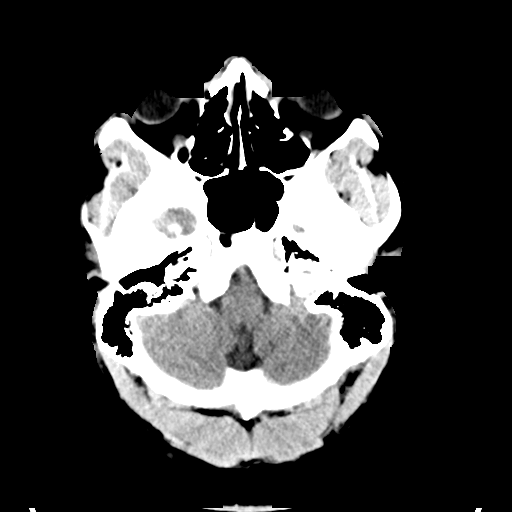
[im 9/32  brain]
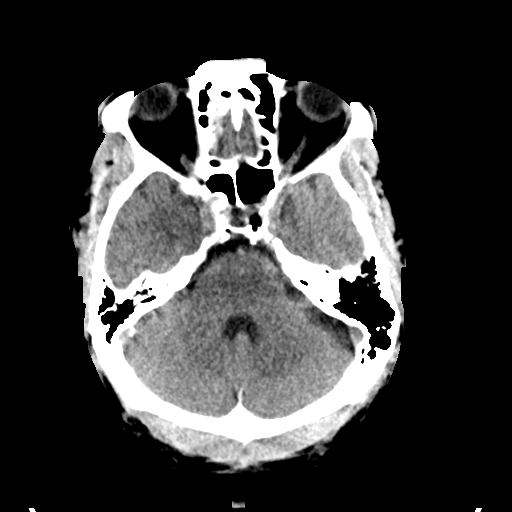
[im 12/32  brain]
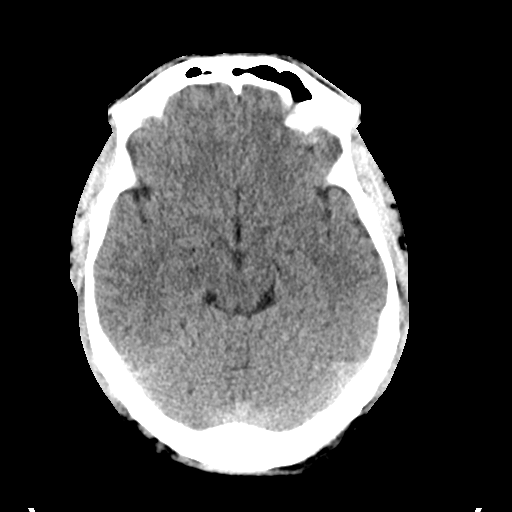
[im 17/32  brain]
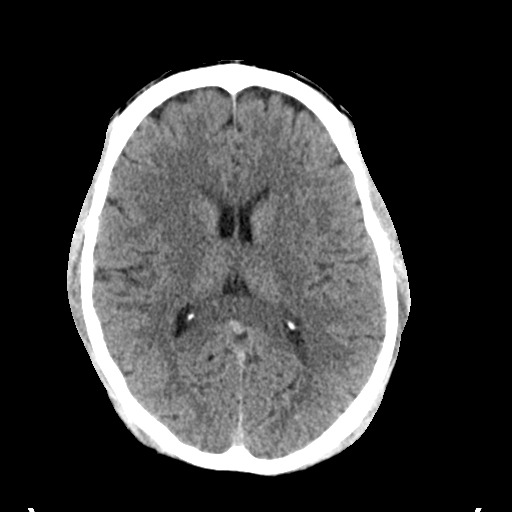
[im 17/32  bone]
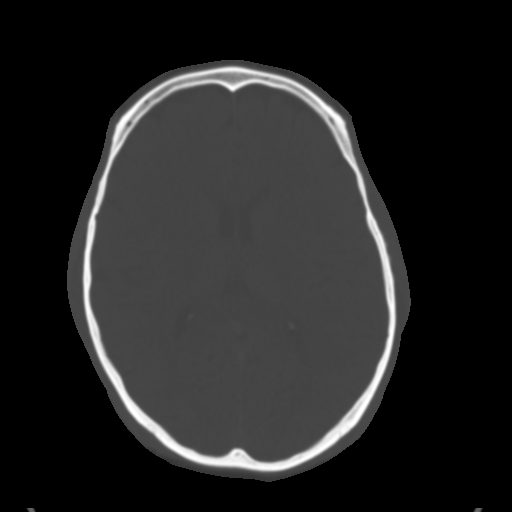
[im 20/32  brain]
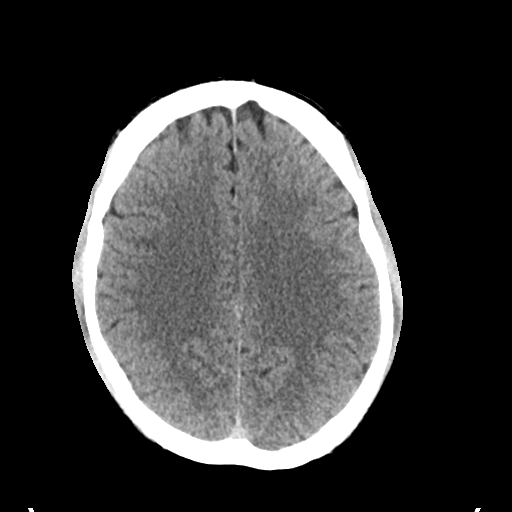
[im 23/32  brain]
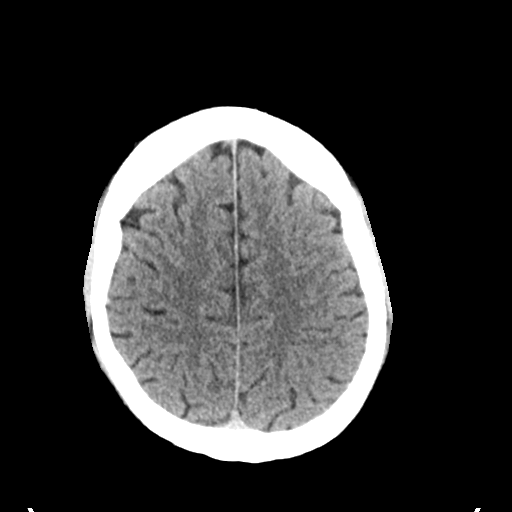
[im 26/32  brain]
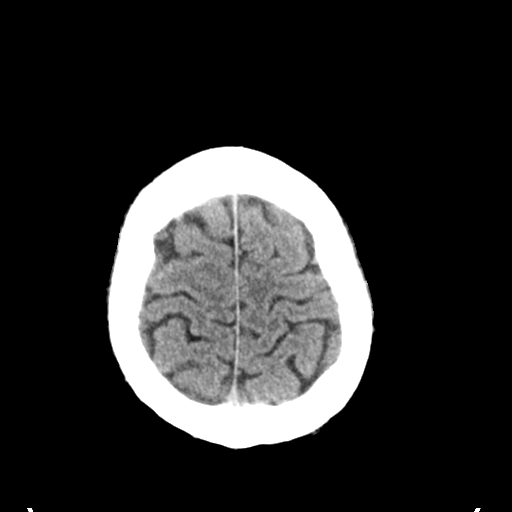
[im 29/32  brain]
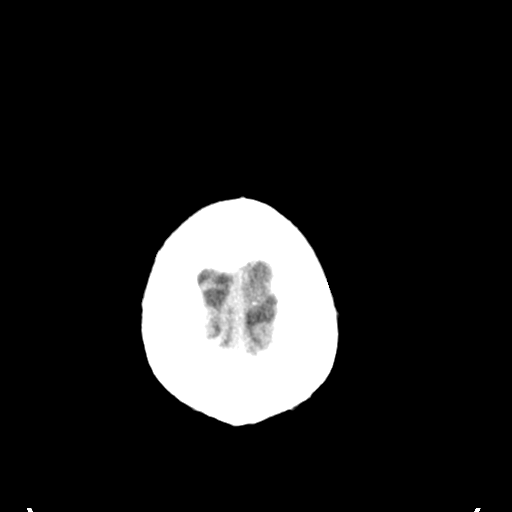
[im 29/32  bone]
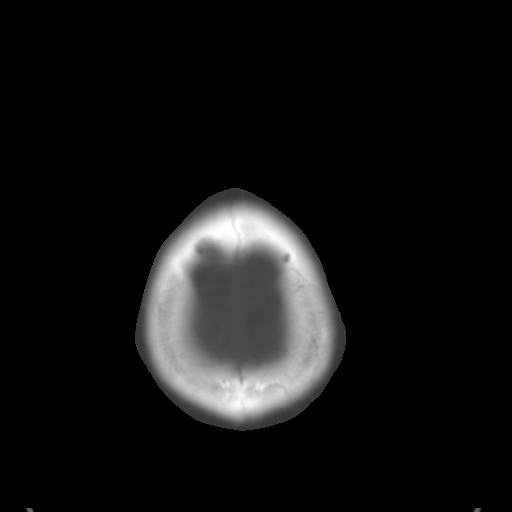

[Series 4: coronal soft tissue · coronal · 0.32mm/px · 3 of 69 slices shown]
[im 23/69  brain]
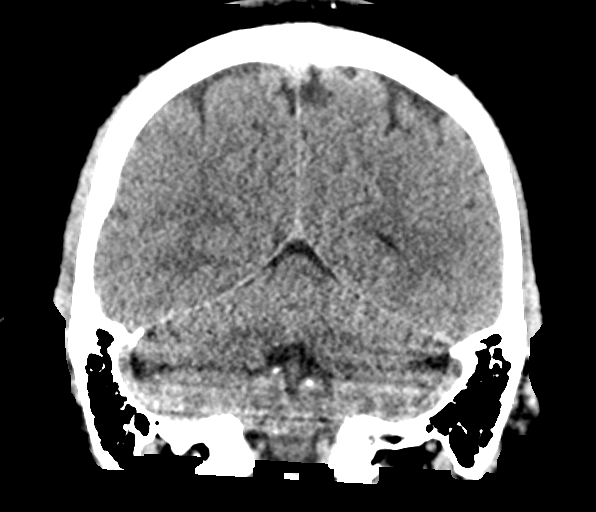
[im 31/69  brain]
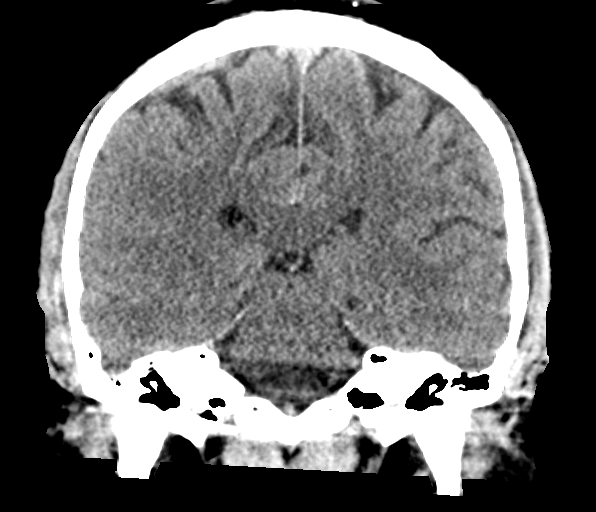
[im 38/69  brain]
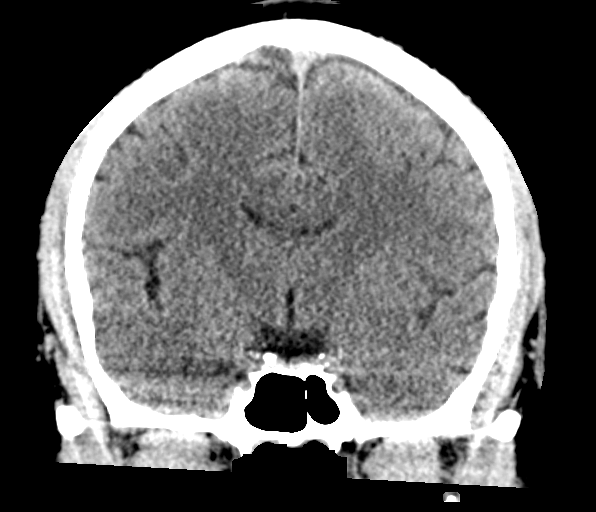

[Series 5: sagittal soft tissue · sagittal · 0.32mm/px · 3 of 64 slices shown]
[im 22/64  brain]
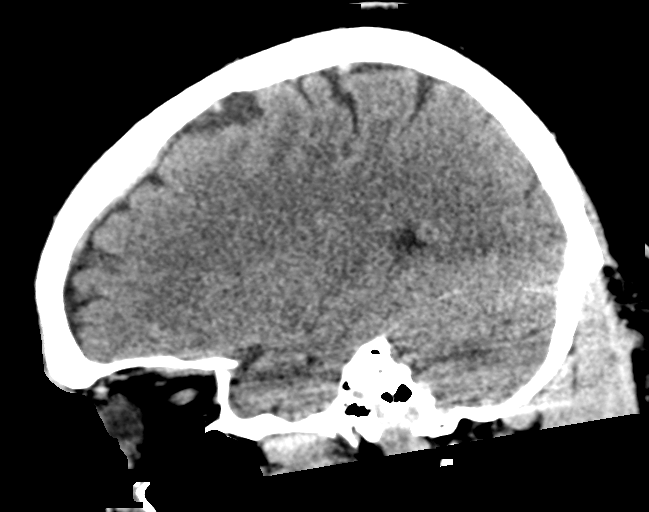
[im 32/64  brain]
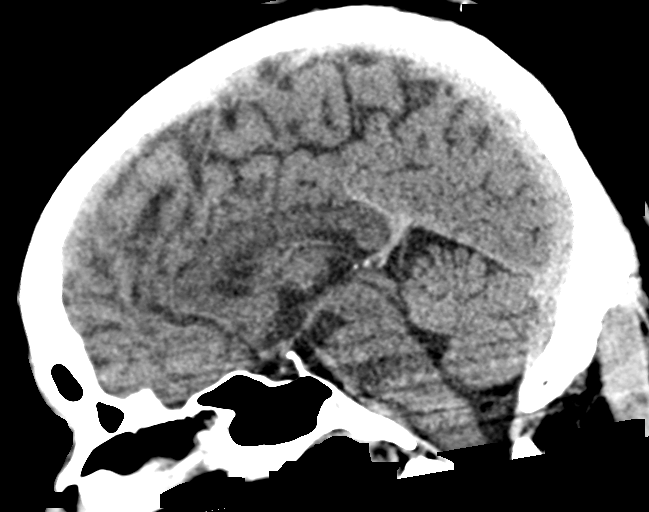
[im 43/64  brain]
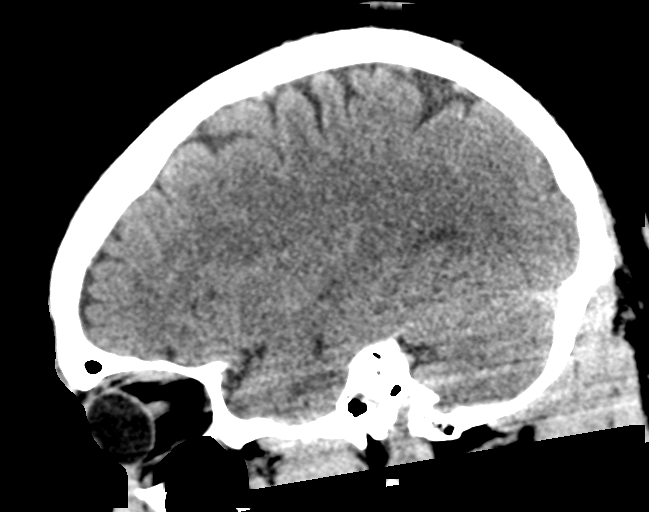

[15 of 47 positions shown; findings below may reference images not displayed]

FINDINGS: Brain: No evidence of acute infarction, hemorrhage, hydrocephalus,
extra-axial collection or mass lesion/mass effect.

Vascular: No hyperdense vessel or unexpected calcification.

Skull: Normal. Negative for fracture or focal lesion.

Sinuses/Orbits: No acute finding.

Other: None.
IMPRESSION: 1. Normal noncontrast head CT.

## 2020-12-05 ENCOUNTER — Other Ambulatory Visit: Payer: Self-pay

## 2020-12-05 ENCOUNTER — Ambulatory Visit
Admission: EM | Admit: 2020-12-05 | Discharge: 2020-12-05 | Disposition: A | Discharge status: Home / Self Care | Payer: Self-pay | Attending: Family Medicine | Admitting: Family Medicine

## 2020-12-05 DIAGNOSIS — L299 Pruritus, unspecified: Secondary | ICD-10-CM

## 2020-12-05 DIAGNOSIS — B880 Other acariasis: Secondary | ICD-10-CM

## 2020-12-05 HISTORY — DX: Essential (primary) hypertension: I10

## 2020-12-05 MED ORDER — PREDNISONE 20 MG PO TABS: 40.0000 mg | ORAL_TABLET | Freq: Every day | ORAL | 0 refills | Status: DC

## 2020-12-05 NOTE — ED Triage Notes (Signed)
Patient presents to Urgent Care with complaints of generalized body rash x 3 days. Pt states he was outdoors unsure if rash is related to the grass or if bug bites.   Denies fever.

## 2020-12-07 NOTE — ED Provider Notes (Signed)
  [facility]   [id number] 12/05/20 Arrival Time: 1426  ASSESSMENT & PLAN:  1. Chigger bites   2. Itching    Begin: Meds ordered this encounter  Medications   predniSONE (DELTASONE) 20 MG tablet    Sig: Take 2 tablets (40 mg total) by mouth daily.    Dispense:  10 tablet    Refill:  0   No signs of skin infection.  Will follow up with PCP or here if worsening or failing to improve as anticipated. Reviewed expectations re: course of current medical issues. Questions answered. Outlined signs and symptoms indicating need for more acute intervention. Patient verbalized understanding. After Visit Summary given.   SUBJECTIVE:  Chad Schultz is a 56 y.o. male who presents with a skin complaint. "Small bumps" mostly over arms but also on legs; noted past 2-3 days after changing tire on truck beside road. Questions bites. Significant itching. Afebrile. No tx PTA.   OBJECTIVE: Vitals:   12/05/20 1437  BP: (!) 154/107  Pulse: 66  Resp: 18  Temp: 98.6 F (37 C)  TempSrc: Oral  SpO2: 98%    General appearance: alert; no distress HEENT: [place]; AT Neck: supple with FROM Lungs: clear to auscultation bilaterally Heart: regular rate and rhythm Extremities: no edema; moves all extremities normally Skin: warm and dry; signs of infection: no; scattered solitary erythematous indurations measuring 2-3 mm each over arms and legs Psychological: alert and cooperative; normal mood and affect  No Known Allergies  Past Medical History:  Diagnosis Date   GERD (gastroesophageal reflux disease)    Hypertension    Social History   Socioeconomic History   Marital status: Married    Spouse name: Not on file   Number of children: Not on file   Years of education: Not on file   Highest education level: Not on file  Occupational History   Not on file  Tobacco Use   Smoking status: Never   Smokeless tobacco: Never  Vaping Use   Vaping Use: Never used  Substance and Sexual  Activity   Alcohol use: Yes    Comment: rare   Drug use: No   Sexual activity: Yes  Other Topics Concern   Not on file  Social History Narrative   Not on file   Social Determinants of Health   Financial Resource Strain: Not on file  Food Insecurity: Not on file  Transportation Needs: Not on file  Physical Activity: Not on file  Stress: Not on file  Social Connections: Not on file  Intimate Partner Violence: Not on file   Family History  Problem Relation Age of Onset   Arthritis Mother    History reviewed. No pertinent surgical history.    Mardella Layman, MD 12/07/20 [phone]

## 2021-10-02 ENCOUNTER — Emergency Department
Admission: EM | Admit: 2021-10-02 | Discharge: 2021-10-02 | Disposition: A | Discharge status: Home / Self Care | Payer: 59 | Attending: Emergency Medicine | Admitting: Emergency Medicine

## 2021-10-02 ENCOUNTER — Other Ambulatory Visit: Payer: Self-pay

## 2021-10-02 DIAGNOSIS — I10 Essential (primary) hypertension: Secondary | ICD-10-CM | POA: Insufficient documentation

## 2021-10-02 DIAGNOSIS — R519 Headache, unspecified: Secondary | ICD-10-CM | POA: Diagnosis not present

## 2021-10-02 LAB — COMPREHENSIVE METABOLIC PANEL
ALT: 25 U/L (ref 0–44)
AST: 29 U/L (ref 15–41)
Albumin: 3.9 g/dL (ref 3.5–5.0)
Alkaline Phosphatase: 79 U/L (ref 38–126)
Anion gap: 6 (ref 5–15)
BUN: 14 mg/dL (ref 6–20)
CO2: 29 mmol/L (ref 22–32)
Calcium: 8.9 mg/dL (ref 8.9–10.3)
Chloride: 106 mmol/L (ref 98–111)
Creatinine, Ser: 1.36 mg/dL — ABNORMAL HIGH (ref 0.61–1.24)
GFR, Estimated: >60 mL/min (ref >60)
Glucose, Bld: 63 mg/dL — ABNORMAL LOW (ref 70–99)
Potassium: 3.7 mmol/L (ref 3.5–5.1)
Sodium: 141 mmol/L (ref 135–145)
Total Bilirubin: 1 mg/dL (ref 0.3–1.2)
Total Protein: 7.9 g/dL (ref 6.5–8.1)

## 2021-10-02 LAB — CBC WITH DIFFERENTIAL/PLATELET
Abs Immature Granulocytes: 0.01 10*3/uL (ref 0.00–0.07)
Basophils Absolute: 0 10*3/uL (ref 0.0–0.1)
Basophils Relative: 1 %
Eosinophils Absolute: 0.1 10*3/uL (ref 0.0–0.5)
Eosinophils Relative: 1 %
HCT: 47.5 % (ref 39.0–52.0)
Hemoglobin: 15.2 g/dL (ref 13.0–17.0)
Immature Granulocytes: 0 %
Lymphocytes Relative: 43 %
Lymphs Abs: 2.4 10*3/uL (ref 0.7–4.0)
MCH: 29.4 pg (ref 26.0–34.0)
MCHC: 32 g/dL (ref 30.0–36.0)
MCV: 91.9 fL (ref 80.0–100.0)
Monocytes Absolute: 0.8 10*3/uL (ref 0.1–1.0)
Monocytes Relative: 14 %
Neutro Abs: 2.3 10*3/uL (ref 1.7–7.7)
Neutrophils Relative %: 41 %
Platelets: 179 10*3/uL (ref 150–400)
RBC: 5.17 MIL/uL (ref 4.22–5.81)
RDW: 13.3 % (ref 11.5–15.5)
WBC: 5.5 10*3/uL (ref 4.0–10.5)
nRBC: 0 % (ref 0.0–0.2)

## 2021-10-02 MED ORDER — PANTOPRAZOLE SODIUM 40 MG PO TBEC
40.0000 mg | DELAYED_RELEASE_TABLET | Freq: Every day | ORAL | Status: DC
  Administered 2021-10-02: 40 mg via ORAL
  Filled 2021-10-02: qty 1

## 2021-10-02 MED ORDER — OMEPRAZOLE 40 MG PO CPDR: 40.0000 mg | DELAYED_RELEASE_CAPSULE | Freq: Every day | ORAL | 2 refills | Status: DC

## 2021-10-02 MED ORDER — AMLODIPINE BESYLATE 10 MG PO TABS: 10.0000 mg | ORAL_TABLET | Freq: Every day | ORAL | 2 refills | Status: DC

## 2021-10-02 MED ORDER — AMLODIPINE BESYLATE 5 MG PO TABS
10.0000 mg | ORAL_TABLET | Freq: Once | ORAL | Status: AC
  Administered 2021-10-02: 10 mg via ORAL
  Filled 2021-10-02: qty 2

## 2021-10-02 NOTE — ED Triage Notes (Signed)
Pt states he has not taken his BP meds in 10 months and has been having high BP and headaches x2 weeks- pt states sometimes drinking water helps with the headaches

## 2021-10-02 NOTE — ED Provider Notes (Signed)
[facility] Provider Note    Event Date/Time   First MD Initiated Contact with Patient 10/02/21 Rickey Primus     (approximate)   History   Hypertension   HPI  Chad Schultz is a 56 y.o. male with a history of hypertension and GERD who presents with elevated blood pressure readings over the last several months with systolic measurements as high as 170 at home.  The patient reports intermittent frontal headaches and generalized fatigue over many months.  He states that he was previously prescribed amlodipine as well as omeprazole for GERD but has not been taking them.  He denies any chest pain or difficulty breathing.  He has no dizziness, weakness, or numbness.  He denies any specific precipitating factor today that made him come into the ER other than just wanting to get this taken care of and get back on medication.    Physical Exam   Triage Vital Signs: ED Triage Vitals [10/02/21 1719]  Enc Vitals Group     BP (!) 164/104     Pulse Rate (!) 56     Resp 20     Temp 98.2 F (36.8 C)     Temp Source Oral     SpO2 100 %     Weight 247 lb (112 kg)     Height 5\' 8"  (1.727 m)     Head Circumference      Peak Flow      Pain Score 5     Pain Loc      Pain Edu?      Excl. in GC?     Most recent vital signs: Vitals:   10/02/21 1719 10/02/21 1907  BP: (!) 164/104 (!) 168/109  Pulse: (!) 56 (!) 54  Resp: 20 19  Temp: 98.2 F (36.8 C) 98 F (36.7 C)  SpO2: 100% 100%     General: Awake, no distress.  CV:  Good peripheral perfusion.  Resp:  Normal effort.  Abd:  No distention.  Other:  No peripheral edema.  Normal gait.   ED Results / Procedures / Treatments   Labs (all labs ordered are listed, but only abnormal results are displayed) Labs Reviewed  COMPREHENSIVE METABOLIC PANEL - Abnormal; Notable for the following components:      Result Value   Glucose, Bld 63 (*)    Creatinine, Ser 1.36 (*)    All other components within normal limits   CBC WITH DIFFERENTIAL/PLATELET     EKG     RADIOLOGY   PROCEDURES:  Critical Care performed: No  Procedures   MEDICATIONS ORDERED IN ED: Medications  pantoprazole (PROTONIX) EC tablet 40 mg (40 mg Oral Given 10/02/21 1907)  amLODipine (NORVASC) tablet 10 mg (10 mg Oral Given 10/02/21 1907)     IMPRESSION / MDM / ASSESSMENT AND PLAN / ED COURSE  I reviewed the triage vital signs and the nursing notes.  56 year old male with PMH as noted above presents due to concern for elevated blood pressure.  He was previously prescribed amlodipine and omeprazole for GERD but has not been taking either of them and wants to start back on them.  I reviewed the past medical records.  The patient was last seen by his primary care provider in July of last year for skin bumps.  He has no recent ED visits or admissions.  On exam he is well-appearing and his vital signs are normal except for hypertension.  Physical exam is otherwise unremarkable.  Differential  diagnosis includes, but is not limited to, essential hypertension versus less likely secondary hypertension.  The patient has no signs or symptoms of hypertensive emergency or any end organ dysfunction.  Patient's presentation is most consistent with exacerbation of chronic illness.  Lab work-up was obtained and is reassuring.  Creatinine is normal.  Electrolytes are normal.  There is no leukocytosis or anemia.  The blood pressure is elevated here but there is no indication for emergent treatment.  I will give a p.o. dose of amlodipine and then prescribe him the same as well as the omeprazole.  The patient states his new insurance is about to kick in and he will be able to follow-up with a primary care provider after this.  I had an extensive discussion with the patient about the results of the work-up, importance of controlling his blood pressure, and the need for follow-up.  I gave him thorough return precautions and he expressed  understanding   FINAL CLINICAL IMPRESSION(S) / ED DIAGNOSES   Final diagnoses:  Hypertension, unspecified type     Rx / DC Orders   ED Discharge Orders          Ordered    amLODipine (NORVASC) 10 MG tablet  Daily        10/02/21 1850    omeprazole (PRILOSEC) 40 MG capsule  Daily        10/02/21 1850             Note:  This document was prepared using Dragon voice recognition software and may include unintentional dictation errors.    Dionne Bucy, MD 10/02/21 Corky Crafts

## 2021-10-02 NOTE — ED Notes (Signed)
Pt gives verbal consent to DC 

## 2021-10-02 NOTE — Discharge Instructions (Addendum)
Take the amlodipine and omeprazole as prescribed.  Make an appointment to follow-up with the primary care doctor as soon as you are able to once her insurance kicks in.  Return to the ER immediately for new, worsening, or persistent severe headache, chest pain, difficulty breathing, weakness or lightheadedness, vision changes, or persistent elevated blood pressure readings especially over 180-200 on the top number over 120 on the bottom number.

## 2021-12-06 DIAGNOSIS — Z6838 Body mass index (BMI) 38.0-38.9, adult: Secondary | ICD-10-CM | POA: Diagnosis not present

## 2022-02-28 ENCOUNTER — Emergency Department: Payer: BC Managed Care – PPO

## 2022-02-28 ENCOUNTER — Emergency Department
Admission: EM | Admit: 2022-02-28 | Discharge: 2022-02-28 | Disposition: A | Discharge status: Home / Self Care | Payer: BC Managed Care – PPO | Attending: Emergency Medicine | Admitting: Emergency Medicine

## 2022-02-28 ENCOUNTER — Encounter: Payer: Self-pay | Admitting: Medical Oncology

## 2022-02-28 DIAGNOSIS — R7989 Other specified abnormal findings of blood chemistry: Secondary | ICD-10-CM | POA: Insufficient documentation

## 2022-02-28 DIAGNOSIS — R079 Chest pain, unspecified: Secondary | ICD-10-CM | POA: Diagnosis not present

## 2022-02-28 DIAGNOSIS — R059 Cough, unspecified: Secondary | ICD-10-CM | POA: Diagnosis not present

## 2022-02-28 DIAGNOSIS — R0789 Other chest pain: Secondary | ICD-10-CM | POA: Insufficient documentation

## 2022-02-28 LAB — BASIC METABOLIC PANEL
Anion gap: 7 (ref 5–15)
BUN: 17 mg/dL (ref 6–20)
CO2: 24 mmol/L (ref 22–32)
Calcium: 9.2 mg/dL (ref 8.9–10.3)
Chloride: 108 mmol/L (ref 98–111)
Creatinine, Ser: 1.26 mg/dL — ABNORMAL HIGH (ref 0.61–1.24)
GFR, Estimated: >60 mL/min (ref >60)
Glucose, Bld: 89 mg/dL (ref 70–99)
Potassium: 4.1 mmol/L (ref 3.5–5.1)
Sodium: 139 mmol/L (ref 135–145)

## 2022-02-28 LAB — CBC
HCT: 45.8 % (ref 39.0–52.0)
Hemoglobin: 14.7 g/dL (ref 13.0–17.0)
MCH: 29.6 pg (ref 26.0–34.0)
MCHC: 32.1 g/dL (ref 30.0–36.0)
MCV: 92.2 fL (ref 80.0–100.0)
Platelets: 219 10*3/uL (ref 150–400)
RBC: 4.97 MIL/uL (ref 4.22–5.81)
RDW: 13.3 % (ref 11.5–15.5)
WBC: 6.4 10*3/uL (ref 4.0–10.5)
nRBC: 0 % (ref 0.0–0.2)

## 2022-02-28 LAB — TROPONIN I (HIGH SENSITIVITY): Troponin I (High Sensitivity): 22 ng/L — ABNORMAL HIGH (ref <18)

## 2022-02-28 MED ORDER — ALUM & MAG HYDROXIDE-SIMETH 200-200-20 MG/5ML PO SUSP
30.0000 mL | Freq: Once | ORAL | Status: AC
  Administered 2022-02-28: 30 mL via ORAL
  Filled 2022-02-28: qty 30

## 2022-02-28 MED ORDER — AMLODIPINE BESYLATE 10 MG PO TABS: 10.0000 mg | ORAL_TABLET | Freq: Every day | ORAL | 2 refills | Status: DC

## 2022-02-28 MED ORDER — IOHEXOL 350 MG/ML SOLN
75.0000 mL | Freq: Once | INTRAVENOUS | Status: AC | PRN
  Administered 2022-02-28: 75 mL via INTRAVENOUS

## 2022-02-28 NOTE — ED Notes (Signed)
E-signature not working at this time. Pt verbalized understanding of D/C instructions, prescriptions and follow up care with no further questions at this time. Pt in NAD and ambulatory at time of D/C.  

## 2022-02-28 NOTE — ED Provider Notes (Signed)
[facility] Provider Note    Event Date/Time   First MD Initiated Contact with Patient 02/28/22 1139     (approximate)   History   Chest Pain   HPI  Chad Schultz is a 56 y.o. male history of high blood pressure who reports he has been out of his blood pressure medication for some time.  He reports that he has had intermittent chest discomfort over the last 1 month.  He describes it as a catching sensation in his chest.  No pain currently.  No shortness of breath.  He is a Naval architect.  No history of blood clots.  No history of heart disease.     Physical Exam   Triage Vital Signs: ED Triage Vitals  Enc Vitals Group     BP 02/28/22 1119 (!) 157/101     Pulse Rate 02/28/22 1119 63     Resp 02/28/22 1119 16     Temp 02/28/22 1119 98 F (36.7 C)     Temp Source 02/28/22 1119 Oral     SpO2 02/28/22 1119 100 %     Weight 02/28/22 1120 112 kg (246 lb 14.6 oz)     Height 02/28/22 1120 1.727 m (5\' 8" )     Head Circumference --      Peak Flow --      Pain Score 02/28/22 1120 6     Pain Loc --      Pain Edu? --      Excl. in GC? --     Most recent vital signs: Vitals:   02/28/22 1119 02/28/22 1442  BP: (!) 157/101 (!) 145/99  Pulse: 63 64  Resp: 16 17  Temp: 98 F (36.7 C) 98 F (36.7 C)  SpO2: 100% 100%     General: Awake, no distress.  CV:  Good peripheral perfusion.  Regular rate and rhythm, no chest wall tenderness palpation Resp:  Normal effort.  See a bilaterally Abd:  No distention.  Other:     ED Results / Procedures / Treatments   Labs (all labs ordered are listed, but only abnormal results are displayed) Labs Reviewed  BASIC METABOLIC PANEL - Abnormal; Notable for the following components:      Result Value   Creatinine, Ser 1.26 (*)    All other components within normal limits  TROPONIN I (HIGH SENSITIVITY) - Abnormal; Notable for the following components:   Troponin I (High Sensitivity) 22 (*)    All other  components within normal limits  CBC     EKG  ED ECG REPORT I, 03/02/22, the attending physician, personally viewed and interpreted this ECG.  Date: 02/28/2022  Rhythm: normal sinus rhythm QRS Axis: normal Intervals: normal ST/T Wave abnormalities: normal Narrative Interpretation: no evidence of acute ischemia    RADIOLOGY Chest x-ray viewed interpreted by me, no acute abnormality    PROCEDURES:  Critical Care performed:   Procedures   MEDICATIONS ORDERED IN ED: Medications  iohexol (OMNIPAQUE) 350 MG/ML injection 75 mL (75 mLs Intravenous Contrast Given 02/28/22 1350)  alum & mag hydroxide-simeth (MAALOX/MYLANTA) 200-200-20 MG/5ML suspension 30 mL (30 mLs Oral Given 02/28/22 1435)     IMPRESSION / MDM / ASSESSMENT AND PLAN / ED COURSE  I reviewed the triage vital signs and the nursing notes. Patient's presentation is most consistent with acute presentation with potential threat to life or bodily function.  Patient describes intermittent episodes of a catching sensation in his chest.  No pain  currently.  Denies heaviness or pressure to the chest.  Differential includes pleurisy, pneumonia, ACS, angina, PE  Doubt ACS given reassuring EKG, no chest pain today.  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  CT scan is negative for PE.  Lab work reviewed.  Mildly elevated high sensitive troponin however given no active troponin, reassuring EKG, not consistent with ACS.  Regardless did offer admission to the patient however he declined stated that he would rather follow-up with cardiology as an outpatient          FINAL CLINICAL IMPRESSION(S) / ED DIAGNOSES   Final diagnoses:  Atypical chest pain     Rx / DC Orders   ED Discharge Orders          Ordered    Ambulatory referral to Cardiology       Comments: If you have not heard from the Cardiology office within the next 72 hours please call  [phone].   02/28/22 1432             Note:  This document was prepared using Dragon voice recognition software and may include unintentional dictation errors.   Lavonia Drafts, MD 02/28/22 [phone]

## 2022-02-28 NOTE — ED Notes (Signed)
Called ct   

## 2022-02-28 NOTE — ED Triage Notes (Signed)
Pt from [facility] with reports that he has been having left sided sharp chest pains with cough x 2 weeks. Pt denies SOB.

## 2022-03-02 DIAGNOSIS — Z1389 Encounter for screening for other disorder: Secondary | ICD-10-CM | POA: Diagnosis not present

## 2022-03-02 DIAGNOSIS — R079 Chest pain, unspecified: Secondary | ICD-10-CM | POA: Diagnosis not present

## 2022-03-02 DIAGNOSIS — Z833 Family history of diabetes mellitus: Secondary | ICD-10-CM | POA: Diagnosis not present

## 2022-03-02 DIAGNOSIS — Z131 Encounter for screening for diabetes mellitus: Secondary | ICD-10-CM | POA: Diagnosis not present

## 2022-03-02 DIAGNOSIS — K219 Gastro-esophageal reflux disease without esophagitis: Secondary | ICD-10-CM | POA: Diagnosis not present

## 2022-03-02 DIAGNOSIS — Z0131 Encounter for examination of blood pressure with abnormal findings: Secondary | ICD-10-CM | POA: Diagnosis not present

## 2022-03-03 ENCOUNTER — Encounter: Payer: Self-pay | Admitting: Cardiology

## 2022-03-03 ENCOUNTER — Ambulatory Visit: Payer: BC Managed Care – PPO | Attending: Cardiology | Admitting: Cardiology

## 2022-03-03 VITALS — BP 150/88 | HR 68 | Ht 67.0 in | Wt 257.1 lb

## 2022-03-03 DIAGNOSIS — Z13220 Encounter for screening for lipoid disorders: Secondary | ICD-10-CM | POA: Diagnosis not present

## 2022-03-03 DIAGNOSIS — R072 Precordial pain: Secondary | ICD-10-CM | POA: Diagnosis not present

## 2022-03-03 DIAGNOSIS — I10 Essential (primary) hypertension: Secondary | ICD-10-CM

## 2022-03-03 MED ORDER — METOPROLOL TARTRATE 100 MG PO TABS: ORAL_TABLET | ORAL | 0 refills | Status: DC

## 2022-03-03 NOTE — Assessment & Plan Note (Signed)
Certainly discussed the concern of obesity and hypertension is 2 major risk factors for CAD.  Discussed importance of diet exercise weight loss however would like to get some information about his coronary anatomy prior to her recommending aggressive exercise.  We will see what his fasting blood sugars look like and check lipid panel/chemistries prior to follow-up.  Needed determine presence of hyperglycemia and/or elevated triglycerides/low HDL as components of metabolic syndrome.

## 2022-03-03 NOTE — Assessment & Plan Note (Signed)
Blood pressure today is in the 150s over 80-90s. Back on Norvasc 10 mg daily.  I suspect he could probably use additional BP control.  Would like to get a little more baseline evaluation first.  In follow-up if he still elevated would probably consider combination of ARB-HCTZ unless there is clear evidence of coronary disease on CTA.

## 2022-03-03 NOTE — Patient Instructions (Addendum)
Medication Instructions:  - Your physician recommends that you continue on your current medications as directed. Please refer to the Current Medication list given to you today.  *If you need a refill on your cardiac medications before your next appointment, please call your pharmacy*   Lab Work: - Your physician recommends that you return for FASTING lab work prior to your follow up with Dr. Herbie Baltimore:  Lipid/ CMET  Medical Mall Entrance at [facility] 1st desk on the right to check in (REGISTRATION)  Lab hours: Monday- Friday (7:30 am- 5:30 pm)   If you have labs (blood work) drawn today and your tests are completely normal, you will receive your results only by: MyChart Message (if you have MyChart) OR A paper copy in the mail If you have any lab test that is abnormal or we need to change your treatment, we will call you to review the results.   Testing/Procedures:  1) Cardiac CT: - Your physician has requested that you have cardiac CT. Cardiac computed tomography (CT) is a painless test that uses an x-ray machine to take clear, detailed pictures of your heart.    [facility] [address] [place], [place] [address] [phone]  OR   [facility] [address] [place], [place] [address] [phone]   If scheduled at [facility] or [facility], please arrive 15 mins early for check-in and test prep.   Please follow these instructions carefully (unless otherwise directed):  Hold all erectile dysfunction medications at least 3 days (72 hrs) prior to test. (Ie viagra, cialis, sildenafil, tadalafil, etc) We will administer nitroglycerin during this exam.   On the Night Before the Test: Be sure to Drink plenty of water. Do not consume any caffeinated/decaffeinated beverages or chocolate 12 hours prior to your test. Do not take any antihistamines 12 hours prior  to your test.  On the Day of the Test: Drink plenty of water until 1 hour prior to the test. Do not eat any food 1 hour prior to test. You may take your regular medications prior to the test.  Take metoprolol tartrate (Lopressor) 100 mg two hours prior to test.       After the Test: Drink plenty of water. After receiving IV contrast, you may experience a mild flushed feeling. This is normal. On occasion, you may experience a mild rash up to 24 hours after the test. This is not dangerous. If this occurs, you can take Benadryl 25 mg and increase your fluid intake. If you experience trouble breathing, this can be serious. If it is severe call 911 IMMEDIATELY. If it is mild, please call our office.  We will call to schedule your test 2-4 weeks out understanding that some insurance companies will need an authorization prior to the service being performed.   For non-scheduling related questions, please contact the cardiac imaging nurse navigator should you have any questions/concerns: Rockwell Alexandria, Cardiac Imaging Nurse Navigator Larey Brick, Cardiac Imaging Nurse Navigator [facility] Direct Office Dial: [phone]   For scheduling needs, including cancellations and rescheduling, please call [place], [phone].   Follow-Up: At [facility], you and your health needs are our priority.  As part of our continuing mission to provide you with exceptional heart care, we have created designated Provider Care Teams.  These Care Teams include your primary Cardiologist (physician) and Advanced Practice Providers (APPs -  Physician Assistants and Nurse  Practitioners) who all work together to provide you with the care you need, when you need it.  We recommend signing up for the patient portal called "MyChart".  Sign up information is provided on this After Visit Summary.  MyChart is used to connect with patients for Virtual Visits (Telemedicine).  Patients are  able to view lab/test results, encounter notes, upcoming appointments, etc.  Non-urgent messages can be sent to your provider as well.   To learn more about what you can do with MyChart, go to [email or web address].    Your next appointment:   4-6 week(s)  The format for your next appointment:   In Person  Provider:   You may see Glenetta Hew, MD or one of the following Advanced Practice Providers on your designated Care Team:   Murray Hodgkins, NP Christell Faith, PA-C Cadence Kathlen Mody, PA-C Gerrie Nordmann, NP    Other Instructions  Cardiac CT Angiogram A cardiac CT angiogram is a procedure to look at the heart and the area around the heart. It may be done to help find the cause of chest pains or other symptoms of heart disease. During this procedure, a substance called contrast dye is injected into the blood vessels in the area to be checked. A large X-ray machine, called a CT scanner, then takes detailed pictures of the heart and the surrounding area. The procedure is also sometimes called a coronary CT angiogram, coronary artery scanning, or CTA. A cardiac CT angiogram allows the health care provider to see how well blood is flowing to and from the heart. The health care provider will be able to see if there are any problems, such as: Blockage or narrowing of the coronary arteries in the heart. Fluid around the heart. Signs of weakness or disease in the muscles, valves, and tissues of the heart. Tell a health care provider about: Any allergies you have. This is especially important if you have had a previous allergic reaction to contrast dye. All medicines you are taking, including vitamins, herbs, eye drops, creams, and over-the-counter medicines. Any blood disorders you have. Any surgeries you have had. Any medical conditions you have. Whether you are pregnant or may be pregnant. Any anxiety disorders, chronic pain, or other conditions you have that may increase your stress or prevent  you from lying still. What are the risks? Generally, this is a safe procedure. However, problems may occur, including: Bleeding. Infection. Allergic reactions to medicines or dyes. Damage to other structures or organs. Kidney damage from the contrast dye that is used. Increased risk of cancer from radiation exposure. This risk is low. Talk with your health care provider about: The risks and benefits of testing. How you can receive the lowest dose of radiation. What happens before the procedure? Wear comfortable clothing and remove any jewelry, glasses, dentures, and hearing aids. Follow instructions from your health care provider about eating and drinking. This may include: For 12 hours before the procedure -- avoid caffeine. This includes tea, coffee, soda, energy drinks, and diet pills. Drink plenty of water or other fluids that do not have caffeine in them. Being well hydrated can prevent complications. For 4-6 hours before the procedure -- stop eating and drinking. The contrast dye can cause nausea, but this is less likely if your stomach is empty. Ask your health care provider about changing or stopping your regular medicines. This is especially important if you are taking diabetes medicines, blood thinners, or medicines to treat problems with erections (erectile dysfunction). What happens  during the procedure?  Hair on your chest may need to be removed so that small sticky patches called electrodes can be placed on your chest. These will transmit information that helps to monitor your heart during the procedure. An IV will be inserted into one of your veins. You might be given a medicine to control your heart rate during the procedure. This will help to ensure that good images are obtained. You will be asked to lie on an exam table. This table will slide in and out of the CT machine during the procedure. Contrast dye will be injected into the IV. You might feel warm, or you may get a  metallic taste in your mouth. You will be given a medicine called nitroglycerin. This will relax or dilate the arteries in your heart. The table that you are lying on will move into the CT machine tunnel for the scan. The person running the machine will give you instructions while the scans are being done. You may be asked to: Keep your arms above your head. Hold your breath. Stay very still, even if the table is moving. When the scanning is complete, you will be moved out of the machine. The IV will be removed. The procedure may vary among health care providers and hospitals. What can I expect after the procedure? After your procedure, it is common to have: A metallic taste in your mouth from the contrast dye. A feeling of warmth. A headache from the nitroglycerin. Follow these instructions at home: Take over-the-counter and prescription medicines only as told by your health care provider. If you are told, drink enough fluid to keep your urine pale yellow. This will help to flush the contrast dye out of your body. Most people can return to their normal activities right after the procedure. Ask your health care provider what activities are safe for you. It is up to you to get the results of your procedure. Ask your health care provider, or the department that is doing the procedure, when your results will be ready. Keep all follow-up visits as told by your health care provider. This is important. Contact a health care provider if: You have any symptoms of allergy to the contrast dye. These include: Shortness of breath. Rash or hives. A racing heartbeat. Summary A cardiac CT angiogram is a procedure to look at the heart and the area around the heart. It may be done to help find the cause of chest pains or other symptoms of heart disease. During this procedure, a large X-ray machine, called a CT scanner, takes detailed pictures of the heart and the surrounding area after a contrast dye has  been injected into blood vessels in the area. Ask your health care provider about changing or stopping your regular medicines before the procedure. This is especially important if you are taking diabetes medicines, blood thinners, or medicines to treat erectile dysfunction. If you are told, drink enough fluid to keep your urine pale yellow. This will help to flush the contrast dye out of your body. This information is not intended to replace advice given to you by your health care provider. Make sure you discuss any questions you have with your health care provider. Document Revised: 08/12/2021 Document Reviewed: 12/19/2018 Elsevier Patient Education  2023 [employer] Inc[employer]   Important Information About Sugar

## 2022-03-03 NOTE — Progress Notes (Signed)
Primary Care Provider: Gennette Pac, [place] Cardiologist: None Electrophysiologist: None  Clinic Note: Chief Complaint  Patient presents with   New Patient (Initial Visit)    Follow up [facility] ER; chest pain. Patient c/o sharp pains in chest that comes and goes with difficulty getting a deep breath. Medications reviewed by the patient verbally.    ===================================  ASSESSMENT/PLAN   Problem List Items Addressed This Visit       Cardiology Problems   Essential hypertension (Chronic)    Blood pressure today is in the 150s over 80-90s. Back on Norvasc 10 mg daily.  I suspect he could probably use additional BP control.  Would like to get a little more baseline evaluation first.  In follow-up if he still elevated would probably consider combination of ARB-HCTZ unless there is clear evidence of coronary disease on CTA.      Relevant Medications   metoprolol tartrate (LOPRESSOR) 100 MG tablet   Other Relevant Orders   EKG 12-Lead   Comp Met (CMET)     Other   Precordial pain - Primary    Relatively atypical sounding symptoms of chest pain.  Not necessarily exertional, not relatively random.  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 Since his symptoms are not exertional in nature I do not think a stress test would be beneficial.  We therefore proceeded Coronary CTA.  This will provide data to support absence or presence of ischemic CAD, but can also just show up at baseline existence of CAD which would help guide further management of the risk factors.      Relevant Orders   EKG 12-Lead   Comp Met (CMET)   CT CORONARY MORPH W/CTA COR W/SCORE W/CA W/CM &/OR WO/CM   Morbidly obese (HCC) (Chronic)    Certainly discussed the concern of obesity and hypertension is 2 major risk factors for CAD.  Discussed importance of diet exercise weight  loss however would like to get some information about his coronary anatomy prior to her recommending aggressive exercise.  We will see what his fasting blood sugars look like and check lipid panel/chemistries prior to follow-up.  Needed determine presence of hyperglycemia and/or elevated triglycerides/low HDL as components of metabolic syndrome.      Other Visit Diagnoses     Screening for hyperlipidemia       Relevant Orders   Lipid Profile       ===================================  HPI:    Chad Schultz is a 56 y.o. male with PMH notable for HTN who is being seen today for the evaluation of Atypical Chest Pain at the request of Gennette Pac, [place].  Recent Hospitalizations:  10/02/2021: [facility] ER visit for hypertension => BP readings in the 170s.  Noted some headaches as well as generalized malaise and fatigue.  I previously been prescribed amlodipine along with omeprazole for GERD but had not been taking them.  At this time denies any chest pain or pressure or dyspnea.  No dizziness lightheadedness weakness.  No preceding symptoms, just concerned about blood pressure. => Prescribed omeprazole 40 mg daily and amlodipine 10 mg daily. Seen by [facility] on 12/06/2021 -> BP check; same medicines reviewed. Seen in [facility] walk-in clinic on 02/28/2022 for BP evaluation along with chest pain and nausea.   "Electricity hitting chest" was told about 7 years ago he may have  heart problems. Never followed up.  Pain noted in the Breast area pain- moving arm makes it worse. Does not radiate anywhere( stays around breast area) lasts a few minutes. Sometimes twice daily. Patient was taking amlodipine- hasn't taking it in the last 2 weeks. => Sent to [facility] ER   Chad Schultz was seen in the [facility] ER 02/28/2022-has been out of his BP meds for over a month.  Noted intermittent chest discomfort during that timeframe.  Describes it as a "catching sensation in his chest.  Ruled  out for MI and PE with PE protocol CT.  Set up for cardiology follow-up.  Out of BP meds x 2 weeks.   Reviewed  CV studies:    The following studies were reviewed today: (if available, images/films reviewed: From Epic Chart or Care Everywhere) 2010 - Nuclear ST: Subtle Reversible defect in septal wall ~ c/w small area of ischemia (but not following coronary artery distribution.  EF 65%.  ? Diaphragmatic attenuation. Patient reports having had a heart catheterization in the past, but cannot find the results.  No intervention performed. CTA PE Chest 02/28/22: No PE    Interval History:   Chad Schultz presents here today for follow-up of his ER visit.  He said that he probably has been out of his amlodipine and omeprazole for 2 weeks prior to going to ER.  He shows me an area up underneath the left pectoral muscle starting from just lateral to the sternum radiating around the rib to the midaxillary line.  He describes it as a sharp fleeting shocklike sensation lasting 5 to 10 seconds and he usually tries to massage it to make it go away.  These episodes are happening with little more frequency and intensity over the last several weeks but he has had 1 episode since his ER visit was this past Sunday.  He does indicate these episodes are usually when he is at rest not associate with any activity.  They do not get worse with activity.  However he does note that he tires more easily and has noticed significant exercise tolerance.  He feels a discomfort in his throat when he exercises but not to the chest discomfort.  With a deep breathing with exercise sometimes brings on the discomfort.  He has not had symptoms not associated with the discomfort other than fatigue and some exertional dyspnea.  He does not really comment on PND orthopnea because he uses CPAP.  No real sensation of irregular heartbeats or palpitations.  Certainly no prolonged rhythm issues.  He has gotten lightheaded dizzy with the chest  discomfort but no syncope or near syncope.  No TIA or amaurosis fugax.  No claudication.  CV Review of Symptoms (Summary): positive for - chest pain, palpitations, shortness of breath, and associated with nausea, fatigue and exercise intolerance; lightheadedness dizziness and nausea associated with chest pain; palpitations are very fleeting.  Maybe once or twice a week if that symptoms less than once a month.  Do not last more than a minute or 2. negative for - dyspnea on exertion, edema, loss of consciousness, orthopnea, paroxysmal nocturnal dyspnea, rapid heart rate, or syncope/near syncope or TIA/amaurosis fugax, claudication  REVIEWED OF SYSTEMS   Review of Systems  Constitutional:  Negative for malaise/fatigue.  HENT:  Positive for congestion.   Respiratory:  Positive for cough and shortness of breath.        3 episodes of coughing associate with nausea and vomiting in the last 2 weeks.  Random episodes.  Not associated with other symptoms.  Gastrointestinal:  Positive for nausea and vomiting. Negative for abdominal pain, blood in stool, constipation, diarrhea and melena.       Associate with cough spells  Genitourinary:  Negative for hematuria.  Musculoskeletal:  Negative for back pain and joint pain.  Neurological:  Positive for dizziness (Associated with chest pain). Negative for focal weakness, loss of consciousness and weakness.  Psychiatric/Behavioral:  Negative for depression and memory loss. The patient has insomnia. The patient is not nervous/anxious.    I have reviewed and (if needed) personally updated the patient's problem list, medications, allergies, past medical and surgical history, social and family history.   PAST MEDICAL HISTORY   Past Medical History:  Diagnosis Date   GERD (gastroesophageal reflux disease)    Hypertension    OSA on CPAP    Using nasal pillows    PAST SURGICAL HISTORY   History reviewed. No pertinent surgical history.  Immunization History   Administered Date(s) Administered   Influenza,inj,Quad PF,6+ Mos 03/10/2016   Tdap 10/09/2015    MEDICATIONS/ALLERGIES   Current Meds  Medication Sig   amLODipine (NORVASC) 10 MG tablet Take 1 tablet (10 mg total) by mouth daily.   metoprolol tartrate (LOPRESSOR) 100 MG tablet Take 1 tablet (100 mg) by mouth 2 hours prior to your Cardiac CT   omeprazole (PRILOSEC) 20 MG capsule Take 20 mg by mouth daily.    No Known Allergies  SOCIAL HISTORY/FAMILY HISTORY   Reviewed in Epic:   Social History   Tobacco Use   Smoking status: Never   Smokeless tobacco: Never  Vaping Use   Vaping Use: Never used  Substance Use Topics   Alcohol use: Yes    Comment: rare   Drug use: No   Social History   Social History Narrative   Not on file   Family History  Problem Relation Age of Onset   Arthritis Mother    Gout Father     OBJCTIVE -PE, EKG, labs   Wt Readings from Last 3 Encounters:  03/03/22 257 lb 2 oz (116.6 kg)  02/28/22 246 lb 14.6 oz (112 kg)  10/02/21 247 lb (112 kg)    Physical Exam: BP (!) 150/88 (BP Location: Left Arm, Patient Position: Sitting, Cuff Size: Large)   Pulse 68   Ht 5' 7"  (1.702 m)   Wt 257 lb 2 oz (116.6 kg)   SpO2 98%   BMI 40.27 kg/m  Physical Exam Vitals reviewed.  Constitutional:      General: He is not in acute distress.    Appearance: Normal appearance. He is not ill-appearing or toxic-appearing.     Comments: Well-groomed.-Well-nourished ; morbidly obese.  HENT:     Head: Normocephalic and atraumatic.  Neck:     Vascular: No carotid bruit or JVD.  Cardiovascular:     Rate and Rhythm: Normal rate and regular rhythm. No extrasystoles are present.    Chest Wall: PMI is not displaced.     Pulses: Normal pulses and intact distal pulses.     Heart sounds: S1 normal and S2 normal. Heart sounds are distant. No murmur heard.    No friction rub. No gallop.  Pulmonary:     Effort: Pulmonary effort is normal. No respiratory distress.      Breath sounds: Normal breath sounds. No wheezing, rhonchi or rales.  Chest:     Chest wall: Tenderness (uder L Breast from sternum to mid-axillary line) present.  Abdominal:  General: Abdomen is flat. Bowel sounds are normal. There is no distension.     Palpations: Abdomen is soft.     Tenderness: There is no abdominal tenderness. There is no guarding or rebound.  Musculoskeletal:        General: Swelling (Trivial bilateral ankle swelling) present. Normal range of motion.     Cervical back: Normal range of motion and neck supple.  Skin:    General: Skin is warm and dry.  Neurological:     General: No focal deficit present.     Mental Status: He is alert and oriented to person, place, and time.     Gait: Gait normal.  Psychiatric:        Mood and Affect: Mood normal.        Behavior: Behavior normal.        Thought Content: Thought content normal.        Judgment: Judgment normal.     Adult ECG Report  Rate: 68 ;  Rhythm: normal sinus rhythm and inferior myocardial infarction, age-indeterminate. ;  Narrative Interpretation: Stable  EKG from 02/28/2022 reviewed    Recent Labs: Reviewed Lab Results  Component Value Date   CHOL 154 05/30/2016   HDL 51.80 05/30/2016   LDLCALC 90 05/30/2016   TRIG 62.0 05/30/2016   CHOLHDL 3 05/30/2016   Lab Results  Component Value Date   CREATININE 1.26 (H) 02/28/2022   BUN 17 02/28/2022   NA 139 02/28/2022   K 4.1 02/28/2022   CL 108 02/28/2022   CO2 24 02/28/2022      Latest Ref Rng & Units 02/28/2022   11:23 AM 10/02/2021    5:22 PM 09/13/2018    7:17 AM  CBC  WBC 4.0 - 10.5 K/uL 6.4  5.5  6.0   Hemoglobin 13.0 - 17.0 g/dL 14.7  15.2  14.8   Hematocrit 39.0 - 52.0 % 45.8  47.5  44.5   Platelets 150 - 400 K/uL 219  179  189     Lab Results  Component Value Date   HGBA1C 6.0 05/30/2016   Lab Results  Component Value Date   TSH 1.12 04/05/2017    ================================================== I spent a total of  45 minutes with the patient spent in direct patient consultation.  Additional time spent with chart review  / charting (studies, outside notes, etc): 35 min Total Time: 80 min  Current medicines are reviewed at length with the patient today.  (+/- concerns) n/a  Notice: This dictation was prepared with Dragon dictation along with smart phrase technology. Any transcriptional errors that result from this process are unintentional and may not be corrected upon review.   Studies Ordered:  Orders Placed This Encounter  Procedures   CT CORONARY MORPH W/CTA COR W/SCORE W/CA W/CM &/OR WO/CM   Lipid Profile   Comp Met (CMET)   EKG 12-Lead   Meds ordered this encounter  Medications   metoprolol tartrate (LOPRESSOR) 100 MG tablet    Sig: Take 1 tablet (100 mg) by mouth 2 hours prior to your Cardiac CT    Dispense:  1 tablet    Refill:  0    Patient Instructions / Medication Changes & Studies & Tests Ordered   Patient Instructions  Medication Instructions:  - Your physician recommends that you continue on your current medications as directed. Please refer to the Current Medication list given to you today.  *If you need a refill on your cardiac medications before your next appointment, please  call your pharmacy*   Lab Work: - Your physician recommends that you return for FASTING lab work prior to your follow up with Dr. Ellyn Hack:  Lipid/ CMET  Medical Mall Entrance at [facility] 1st desk on the right to check in (REGISTRATION)  Lab hours: Monday- Friday (7:30 am- 5:30 pm)  Testing/Procedures:  1) Cardiac CT: - Your physician has requested that you have cardiac CT. Cardiac computed tomography (CT) is a painless test that uses an x-ray machine to take clear, detailed pictures of your heart.   Follow-Up: At [facility], you and your health needs are our priority.  As part of our continuing mission to provide you with exceptional heart care, we have created designated Provider Care  Teams.  These Care Teams include your primary Cardiologist (physician) and Advanced Practice Providers (APPs -  Physician Assistants and Nurse Practitioners) who all work together to provide you with the care you need, when you need it.  We recommend signing up for the patient portal called "MyChart".  Sign up information is provided on this After Visit Summary.  MyChart is used to connect with patients for Virtual Visits (Telemedicine).  Patients are able to view lab/test results, encounter notes, upcoming appointments, etc.  Non-urgent messages can be sent to your provider as well.   To learn more about what you can do with MyChart, go to [email or web address].    Your next appointment:   4-6 week(s)  The format for your next appointment:   In Person  Provider:   You may see Glenetta Hew, MD or one of the following Advanced Practice Providers on your designated Care Team:   Murray Hodgkins, NP Christell Faith, PA-C Cadence Kathlen Mody, PA-C Gerrie Nordmann, NP    Other Instructions  Cardiac CT Angiogram    Leonie Man, MD, MS Glenetta Hew, M.D., M.S. Interventional Cardiologist  [facility]   [address] [place], [place]  [address] [phone]           Fax [phone]    Thank you for choosing [facility] in [place]!!

## 2022-03-03 NOTE — Assessment & Plan Note (Signed)
Relatively atypical sounding symptoms of chest pain.  Not necessarily exertional, not relatively random.  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 Since his symptoms are not exertional in nature I do not think a stress test would be beneficial.  We therefore proceeded Coronary CTA.  This will provide data to support absence or presence of ischemic CAD, but can also just show up at baseline existence of CAD which would help guide further management of the risk factors.

## 2022-03-08 ENCOUNTER — Telehealth (HOSPITAL_COMMUNITY): Payer: Self-pay | Admitting: *Deleted

## 2022-03-08 NOTE — Telephone Encounter (Signed)
Reaching out to patient to offer assistance regarding upcoming cardiac imaging study; pt verbalizes understanding of appt date/time, parking situation and where to check in,  medications ordered, and verified current allergies; name and call back number provided for further questions should they arise  Gordy Clement RN Navigator Cardiac Imaging [facility] [phone] office [phone] cell  Patient to take 50mg  metoprolol tartrate two hours prior to her cardiac CT scan.  She is aware to arrive at 1pm.

## 2022-03-09 ENCOUNTER — Encounter (HOSPITAL_COMMUNITY): Payer: Self-pay

## 2022-03-09 ENCOUNTER — Ambulatory Visit (HOSPITAL_COMMUNITY)
Admission: RE | Admit: 2022-03-09 | Discharge: 2022-03-09 | Disposition: A | Discharge status: Home / Self Care | Payer: BC Managed Care – PPO | Source: Ambulatory Visit | Attending: Cardiology | Admitting: Cardiology

## 2022-03-09 DIAGNOSIS — R072 Precordial pain: Secondary | ICD-10-CM | POA: Diagnosis not present

## 2022-03-09 MED ORDER — NITROGLYCERIN 0.4 MG SL SUBL
SUBLINGUAL_TABLET | SUBLINGUAL | Status: AC
  Filled 2022-03-09: qty 2

## 2022-03-09 MED ORDER — NITROGLYCERIN 0.4 MG SL SUBL
0.8000 mg | SUBLINGUAL_TABLET | Freq: Once | SUBLINGUAL | Status: AC
  Administered 2022-03-09: 0.8 mg via SUBLINGUAL

## 2022-03-09 MED ORDER — IOHEXOL 350 MG/ML SOLN
100.0000 mL | Freq: Once | INTRAVENOUS | Status: AC | PRN
  Administered 2022-03-09: 100 mL via INTRAVENOUS

## 2022-04-14 NOTE — Progress Notes (Addendum)
Cardiology Office Note    Date:  04/15/2022   ID:  CALLAHAN MONDRY, DOB 1965-12-13, MRN [id number]  PCP:  Gennette Pac, FNP  Cardiologist:  Glenetta Hew, MD  Electrophysiologist:  None   Chief Complaint: follow up for atypical chest pain and hypertension  History of Present Illness:   Chad Schultz is a 56 y.o. male with a history of hypertension, GERD, OSA (on CPAP), CKD stage 2.   Presented to ED on 02/28/22 with atypical chest pain. He had been out of his antihypertensive for some time and was reporting intermittent chest discomfort. 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 Troponins were mildly elevated (22), 12 lead unremarkable. Decision was made that he would f/u with outpatient cardiology, and norvasc 10 mg daily was restarted.   Evaluated by Dr. Ellyn Hack on 03/03/22, BP was elevated, decision was made for coronary CTA since his CP was not exacerbated by exercise. CTA results showed calcium score of 0, with normal coronary arteries.   He presents today for follow up of his atypical chest pain and hypertension. He has been doing well, reports one episode of electrical type chest pain that lasted a few seconds, but no other chest pain. He denies SOB, palpitations, orthopnea, PND. He checks his BP sporadically and reports they are usually 120's-130's/80's. He is a Programmer, systems and eating the correct foods and adhering to his medication regimen has been challenging for him. His BP was initially elevated in the office, 144/95, recheck 126/64, He has not taken his amlodipine x 4 days, states he forgets easily. Discussed importance of adherence and managing his BP. He is interested in homeopathic treatment options and would like his medication regimen to be as simple as possible. Discussed multiple strategies to lose weight, reduce sodium, which will help with his BP management but in the interim the importance taking his amlodipine as  directed. Needs his cholesterol checked, but he is not fasting and would not take anything for his cholesterol, should it be elevated. He wants to get established with a homeopathic physician.   Labwork independently reviewed: 02/28/22 - Na 139, K 4.1, Cr 1.26, gfr > 60, troponin 22 04/05/17 - GFR 69    Cardiology Studies:   Studies reviewed are outlined and summarized above. Reports included below if pertinent.   03/09/22 Coronary CTA -  1. Coronary calcium score of 0. This was 0 percentile for age-, sex, and race-matched controls.   2.  Normal coronary origin with Co dominance.   3.  Normal coronary arteries.  CAD RADS 0.   4.  Consider non atherosclerotic causes of chest pain.  02/28/22 CTA PE Chest : No PE    2010 - Nuclear ST:  Subtle Reversible defect in septal wall ~ c/w small area of ischemia (but not following coronary artery distribution.  EF 65%.  ? Diaphragmatic attenuation. Patient reports having had a heart catheterization in the past, but cannot find the results.  No intervention performed.     Past Medical History:  Diagnosis Date   Atypical chest pain    03/09/22 CTA, calcium score 0, normal arteries   GERD (gastroesophageal reflux disease)    Hypertension    OSA on CPAP    Using nasal pillows    History reviewed. No pertinent surgical history.  Current Medications: Current Meds  Medication Sig   amLODipine (NORVASC) 10 MG tablet Take 1 tablet (10 mg total) by mouth daily.  omeprazole (PRILOSEC) 20 MG capsule Take 20 mg by mouth daily.      Allergies:   Patient has no known allergies.   Social History   Socioeconomic History   Marital status: Married    Spouse name: Not on file   Number of children: Not on file   Years of education: Not on file   Highest education level: Not on file  Occupational History   Occupation: Truck Education administrator: [employer]  Tobacco Use   Smoking status: Never   Smokeless tobacco: Never  Vaping Use   Vaping  Use: Never used  Substance and Sexual Activity   Alcohol use: Yes    Comment: rare   Drug use: No   Sexual activity: Yes  Other Topics Concern   Not on file  Social History Narrative   Not on file   Social Determinants of Health   Financial Resource Strain: Not on file  Food Insecurity: Not on file  Transportation Needs: Not on file  Physical Activity: Not on file  Stress: Not on file  Social Connections: Not on file     Family History:  The patient's family history includes Arthritis in his mother; Gout in his father.  ROS:   ROS    EKG(s)/Additional Labs   EKG:  EKG is not ordered today.    Recent Labs: 10/02/2021: ALT 25 02/28/2022: BUN 17; Creatinine, Ser 1.26; Hemoglobin 14.7; Platelets 219; Potassium 4.1; Sodium 139  Recent Lipid Panel    Component Value Date/Time   CHOL 154 05/30/2016 1119   TRIG 62.0 05/30/2016 1119   HDL 51.80 05/30/2016 1119   CHOLHDL 3 05/30/2016 1119   VLDL 12.4 05/30/2016 1119   LDLCALC 90 05/30/2016 1119    PHYSICAL EXAM:    VS:  BP 126/68   Pulse 79   Ht 5\' 8"  (1.727 m)   Wt 256 lb (116.1 kg)   SpO2 98%   BMI 38.92 kg/m   BMI: Body mass index is 38.92 kg/m.  GEN: , obese, well nourished, well developed male in no acute distress HEENT: normocephalic, atraumatic Neck: no JVD, carotid bruits, or masses Cardiac: RRR; no murmurs, rubs, or gallops, trace edema at sockline  Respiratory:  clear to auscultation bilaterally, normal work of breathing GI: soft, nontender, nondistended, + BS MS: no deformity or atrophy Skin: warm and dry, no rash Neuro:  Alert and Oriented x 3, Strength and sensation are intact, follows commands Psych: euthymic mood, full affect  Wt Readings from Last 3 Encounters:  04/15/22 256 lb (116.1 kg)  03/03/22 257 lb 2 oz (116.6 kg)  02/28/22 246 lb 14.6 oz (112 kg)     ASSESSMENT & PLAN:   Hypertension - He is taking amlodipine 10 mg daily. Initial BP was elevated 144/95, rechecked 126/64. 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 Discussed importance of taking his medication as prescribed and discussed adverse effects from unmanaged BP. Continues to drink several energy drinks/day. Encouraged him to eliminate this as this will elevate his BP.  Atypical chest pain - He has only had one episode since his last visit, sensation described as an electrical shock and lasted ~ 15 seconds. Calcium score of 0 with normal coronary arteries. Does not sound to be cardiac in nature. Continues to drink several energy drinks/day. Encouraged him to eliminate this as this could be causing the sensation he has described.  HLD - Previous LDL 90, he  is in between PCP providers at this time. He is not fasting today and states he would not take any cholesterol lowering medications, should it be elevated. He wants to get established with a homeopathic PCP in the area and will defer to them for cholesterol management.  GERD - Prilosec 20 mg daily. Continues to drink several energy drinks/day. Encouraged him to eliminate this as this will cause more issues with his GERD.      Disposition: F/u with Dr. Herbie Baltimore in 1 year.    Medication Adjustments/Labs and Tests Ordered: Current medicines are reviewed at length with the patient today.  Concerns regarding medicines are outlined above. Medication changes, Labs and Tests ordered today are summarized above and listed in the Patient Instructions accessible in Encounters.   Signed, Flossie Dibble, NP  04/15/2022 3:43 PM    [facility] Phone: [phone]; Fax: [phone]

## 2022-04-15 ENCOUNTER — Encounter: Payer: Self-pay | Admitting: Nurse Practitioner

## 2022-04-15 ENCOUNTER — Ambulatory Visit: Payer: BC Managed Care – PPO | Attending: Nurse Practitioner | Admitting: Cardiology

## 2022-04-15 VITALS — BP 126/68 | HR 79 | Ht 68.0 in | Wt 256.0 lb

## 2022-04-15 DIAGNOSIS — R072 Precordial pain: Secondary | ICD-10-CM | POA: Diagnosis not present

## 2022-04-15 DIAGNOSIS — I10 Essential (primary) hypertension: Secondary | ICD-10-CM | POA: Diagnosis not present

## 2022-04-15 DIAGNOSIS — N182 Chronic kidney disease, stage 2 (mild): Secondary | ICD-10-CM

## 2022-04-15 NOTE — Patient Instructions (Signed)
Medication Instructions:  Take your medicines as prescribed *If you need a refill on your cardiac medications before your next appointment, please call your pharmacy*   Lab Work: None If you have labs (blood work) drawn today and your tests are completely normal, you will receive your results only by: MyChart Message (if you have MyChart) OR A paper copy in the mail If you have any lab test that is abnormal or we need to change your treatment, we will call you to review the results.   Testing/Procedures: None   Follow-Up: At [facility], you and your health needs are our priority.  As part of our continuing mission to provide you with exceptional heart care, we have created designated Provider Care Teams.  These Care Teams include your primary Cardiologist (physician) and Advanced Practice Providers (APPs -  Physician Assistants and Nurse Practitioners) who all work together to provide you with the care you need, when you need it.  We recommend signing up for the patient portal called "MyChart".  Sign up information is provided on this After Visit Summary.  MyChart is used to connect with patients for Virtual Visits (Telemedicine).  Patients are able to view lab/test results, encounter notes, upcoming appointments, etc.  Non-urgent messages can be sent to your provider as well.   To learn more about what you can do with MyChart, go to [email or web address].    Your next appointment:   1 year(s)  The format for your next appointment:   In Person  Provider:   Bryan Lemma, MD    Other Instructions            Important Information About Sugar

## 2022-09-10 ENCOUNTER — Encounter: Payer: Self-pay | Admitting: Intensive Care

## 2022-09-10 ENCOUNTER — Other Ambulatory Visit: Payer: Self-pay

## 2022-09-10 ENCOUNTER — Emergency Department
Admission: EM | Admit: 2022-09-10 | Discharge: 2022-09-10 | Disposition: A | Discharge status: Home / Self Care | Payer: BC Managed Care – PPO | Attending: Emergency Medicine | Admitting: Emergency Medicine

## 2022-09-10 DIAGNOSIS — M5440 Lumbago with sciatica, unspecified side: Secondary | ICD-10-CM | POA: Insufficient documentation

## 2022-09-10 DIAGNOSIS — M5430 Sciatica, unspecified side: Secondary | ICD-10-CM

## 2022-09-10 DIAGNOSIS — I10 Essential (primary) hypertension: Secondary | ICD-10-CM

## 2022-09-10 NOTE — ED Provider Notes (Signed)
   [facility] Provider Note    Event Date/Time   First MD Initiated Contact with Patient 09/10/22 [phone]     (approximate)   History   Leg Pain   HPI Chad Schultz is a 57 y.o. male with history of htn presents with low back pain with radiation to left leg, no known injury, sx for 2 days, took naproxen and tylenol without relief, no uti sx       Physical Exam   Triage Vital Signs: ED Triage Vitals [09/10/22 0753]  Enc Vitals Group     BP (!) 148/105     Pulse Rate 72     Resp 16     Temp 98.1 F (36.7 C)     Temp Source Oral     SpO2 97 %     Weight 245 lb (111.1 kg)     Height 5\' 7"  (1.702 m)     Head Circumference      Peak Flow      Pain Score 8     Pain Loc      Pain Edu?      Excl. in GC?     Most recent vital signs: Vitals:   09/10/22 0753  BP: (!) 148/105  Pulse: 72  Resp: 16  Temp: 98.1 F (36.7 C)  SpO2: 97%     General: Awake, no distress.   CV:  Good peripheral perfusion. regular rate and  rhythm Resp:  Normal effort. Abd:  No distention.   Other:  Lumbar spine is not tender, left buttock tender, pain reproduced with palpation   ED Results / Procedures / Treatments   Labs (all labs ordered are listed, but only abnormal results are displayed) Labs Reviewed - No data to display   EKG     RADIOLOGY     PROCEDURES:   Procedures   MEDICATIONS ORDERED IN ED: Medications - No data to display   IMPRESSION / MDM / ASSESSMENT AND PLAN / ED COURSE  I reviewed the triage vital signs and the nursing notes.                              Differential diagnosis includes, but is not limited to, lumbar strain, sciatica, lumbar radiculopathy, ruptured disc  Patient's presentation is most consistent with acute illness / injury with system symptoms.   Patient was given a prescription for Sterapred, baclofen, he also asked for refill on blood pressure medication.  I gave him a prescription for amlodipine 10 mg  for 90 days.  He is to follow-up with a regular doctor or PCP if not improving.  Follow-up with his regular doctor concerning his blood pressure.  Patient is in agreement treatment plan.  He was discharged stable condition.  Patient was given handwritten prescriptions as we had no access to printers      FINAL CLINICAL IMPRESSION(S) / ED DIAGNOSES   Final diagnoses:  Acute sciatica  Hypertension, unspecified type     Rx / DC Orders   ED Discharge Orders     None        Note:  This document was prepared using Dragon voice recognition software and may include unintentional dictation errors.    Faythe Ghee, PA-C 09/10/22 [id number]    Dionne Bucy, MD 09/10/22 [phone]

## 2022-09-10 NOTE — ED Notes (Signed)
LATE ENTRY: Pt discharged at 0855 by Celesta Gentile, PA during downtime. Paper prescription for Baclofen given to patient.

## 2022-09-10 NOTE — ED Triage Notes (Signed)
Patient reports pain started in his left lower back and radiates down his leg. Started two days ago.

## 2023-10-13 ENCOUNTER — Encounter: Payer: Self-pay | Admitting: Cardiology

## 2024-06-10 ENCOUNTER — Encounter: Payer: Self-pay | Admitting: *Deleted

## 2024-06-10 ENCOUNTER — Other Ambulatory Visit: Payer: Self-pay

## 2024-06-10 ENCOUNTER — Emergency Department: Payer: Self-pay

## 2024-06-10 ENCOUNTER — Emergency Department
Admission: EM | Admit: 2024-06-10 | Discharge: 2024-06-10 | Disposition: A | Discharge status: Home / Self Care | Payer: Self-pay

## 2024-06-10 DIAGNOSIS — I10 Essential (primary) hypertension: Secondary | ICD-10-CM | POA: Insufficient documentation

## 2024-06-10 DIAGNOSIS — J069 Acute upper respiratory infection, unspecified: Secondary | ICD-10-CM | POA: Insufficient documentation

## 2024-06-10 LAB — RESP PANEL BY RT-PCR (RSV, FLU A&B, COVID)  RVPGX2
Influenza A by PCR: NEGATIVE
Influenza B by PCR: NEGATIVE
Resp Syncytial Virus by PCR: NEGATIVE
SARS Coronavirus 2 by RT PCR: NEGATIVE

## 2024-06-10 MED ORDER — LIDOCAINE 5 % EX PTCH: 1.0000 | MEDICATED_PATCH | Freq: Two times a day (BID) | CUTANEOUS | 0 refills | Status: AC | PRN

## 2024-06-10 MED ORDER — AMLODIPINE BESYLATE 10 MG PO TABS: 10.0000 mg | ORAL_TABLET | Freq: Every day | ORAL | 2 refills | Status: AC

## 2024-06-10 MED ORDER — BENZONATATE 100 MG PO CAPS: 200.0000 mg | ORAL_CAPSULE | Freq: Three times a day (TID) | ORAL | 0 refills | Status: AC | PRN

## 2024-06-10 MED ORDER — LIDOCAINE 5 % EX PTCH
1.0000 | MEDICATED_PATCH | Freq: Once | CUTANEOUS | Status: DC
  Administered 2024-06-10: 1 via TRANSDERMAL
  Filled 2024-06-10: qty 1

## 2024-06-10 NOTE — Discharge Instructions (Addendum)
 Your viral panel test and chest x-ray are normal at this time.  No signs of any acute infection, pneumonia, or bronchitis.  Take the prescription Meds, including a blood pressure medicine as directed.  You may also consider taking OTC Delsym for additional cough relief.  Follow-up with your primary provider for ongoing evaluation.  Return to the ED necessary.

## 2024-06-10 NOTE — ED Triage Notes (Signed)
 Pt ambulatory to triage.  Pt has a cough, fever, runny nose.  No otc meds.  Sx began yesterday.  Pt alert   speech clear.

## 2024-06-10 NOTE — ED Notes (Signed)
 Pt states he is having cold like symptoms and right rib pain.
